# Patient Record
Sex: Male | Born: 1990 | Race: Black or African American | Hispanic: No | Marital: Single
Health system: Southern US, Community
[De-identification: ages and names within clinical notes are randomized; demographics above are authoritative.]

## PROBLEM LIST (undated history)

## (undated) DIAGNOSIS — K219 Gastro-esophageal reflux disease without esophagitis: Secondary | ICD-10-CM

## (undated) DIAGNOSIS — R011 Cardiac murmur, unspecified: Secondary | ICD-10-CM

## (undated) DIAGNOSIS — J189 Pneumonia, unspecified organism: Secondary | ICD-10-CM

## (undated) DIAGNOSIS — J302 Other seasonal allergic rhinitis: Secondary | ICD-10-CM

## (undated) DIAGNOSIS — G7101 Duchenne or Becker muscular dystrophy: Secondary | ICD-10-CM

## (undated) HISTORY — PX: SPINAL FUSION: SHX223

## (undated) HISTORY — PX: BACK SURGERY: SHX140

## (undated) HISTORY — DX: Gastro-esophageal reflux disease without esophagitis: K21.9

## (undated) HISTORY — PX: ABDOMINAL SURGERY: SHX537

## (undated) HISTORY — PX: GASTROSTOMY TUBE PLACEMENT: SHX655

## (undated) HISTORY — PX: HIP FRACTURE SURGERY: SHX118

---
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
## 2011-02-18 LAB — BASIC METABOLIC PANEL
CO2: 19
Calcium: 9.3
Chloride: 102
Creatinine, Ser: 0.34 — ABNORMAL LOW
Glucose, Bld: 75
Sodium: 137

## 2011-08-28 ENCOUNTER — Emergency Department (HOSPITAL_COMMUNITY): Payer: Medicaid Other

## 2011-08-28 ENCOUNTER — Emergency Department (HOSPITAL_COMMUNITY)
Admission: EM | Admit: 2011-08-28 | Discharge: 2011-08-28 | Disposition: A | Discharge status: Home / Self Care | Payer: Medicaid Other | Attending: Emergency Medicine | Admitting: Emergency Medicine

## 2011-08-28 ENCOUNTER — Encounter (HOSPITAL_COMMUNITY): Payer: Self-pay

## 2011-08-28 DIAGNOSIS — S82839A Other fracture of upper and lower end of unspecified fibula, initial encounter for closed fracture: Secondary | ICD-10-CM

## 2011-08-28 DIAGNOSIS — M25473 Effusion, unspecified ankle: Secondary | ICD-10-CM | POA: Insufficient documentation

## 2011-08-28 DIAGNOSIS — IMO0002 Reserved for concepts with insufficient information to code with codable children: Secondary | ICD-10-CM | POA: Insufficient documentation

## 2011-08-28 DIAGNOSIS — G7109 Other specified muscular dystrophies: Secondary | ICD-10-CM | POA: Insufficient documentation

## 2011-08-28 DIAGNOSIS — Z79899 Other long term (current) drug therapy: Secondary | ICD-10-CM | POA: Insufficient documentation

## 2011-08-28 DIAGNOSIS — M25476 Effusion, unspecified foot: Secondary | ICD-10-CM | POA: Insufficient documentation

## 2011-08-28 DIAGNOSIS — M25579 Pain in unspecified ankle and joints of unspecified foot: Secondary | ICD-10-CM | POA: Insufficient documentation

## 2011-08-28 DIAGNOSIS — S82899A Other fracture of unspecified lower leg, initial encounter for closed fracture: Secondary | ICD-10-CM | POA: Insufficient documentation

## 2011-08-28 MED ORDER — HYDROCODONE-ACETAMINOPHEN 5-325 MG PO TABS: 1.0000 | ORAL_TABLET | Freq: Four times a day (QID) | ORAL | Status: DC | PRN

## 2011-08-28 MED ORDER — HYDROCODONE-ACETAMINOPHEN 7.5-500 MG/15ML PO SOLN: 15.0000 mL | Freq: Four times a day (QID) | ORAL | Status: AC | PRN
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
## 2011-08-28 NOTE — Discharge Instructions (Signed)
Follow up with the orthopedist provided.  Ice and elevate the ankle.  Return here as needed.

## 2011-08-30 NOTE — ED Provider Notes (Signed)
Medical screening examination/treatment/procedure(s) were performed by non-physician practitioner and as supervising physician I was immediately available for consultation/collaboration.   Babette Relic, MD 08/30/11 2211

## 2012-06-12 ENCOUNTER — Encounter (HOSPITAL_COMMUNITY): Payer: Self-pay | Admitting: Family Medicine

## 2012-06-12 ENCOUNTER — Emergency Department (HOSPITAL_COMMUNITY)
Admission: EM | Admit: 2012-06-12 | Discharge: 2012-06-12 | Disposition: A | Discharge status: Home / Self Care | Payer: Medicaid Other | Attending: Emergency Medicine | Admitting: Emergency Medicine

## 2012-06-12 ENCOUNTER — Emergency Department (HOSPITAL_COMMUNITY): Payer: Medicaid Other

## 2012-06-12 DIAGNOSIS — Y939 Activity, unspecified: Secondary | ICD-10-CM | POA: Insufficient documentation

## 2012-06-12 DIAGNOSIS — W06XXXA Fall from bed, initial encounter: Secondary | ICD-10-CM | POA: Insufficient documentation

## 2012-06-12 DIAGNOSIS — G7109 Other specified muscular dystrophies: Secondary | ICD-10-CM | POA: Insufficient documentation

## 2012-06-12 DIAGNOSIS — Y929 Unspecified place or not applicable: Secondary | ICD-10-CM | POA: Insufficient documentation

## 2012-06-12 DIAGNOSIS — Z993 Dependence on wheelchair: Secondary | ICD-10-CM | POA: Insufficient documentation

## 2012-06-12 DIAGNOSIS — Z8739 Personal history of other diseases of the musculoskeletal system and connective tissue: Secondary | ICD-10-CM | POA: Insufficient documentation

## 2012-06-12 DIAGNOSIS — S96919A Strain of unspecified muscle and tendon at ankle and foot level, unspecified foot, initial encounter: Secondary | ICD-10-CM

## 2012-06-12 DIAGNOSIS — S93609A Unspecified sprain of unspecified foot, initial encounter: Secondary | ICD-10-CM | POA: Insufficient documentation

## 2012-06-12 DIAGNOSIS — Z79899 Other long term (current) drug therapy: Secondary | ICD-10-CM | POA: Insufficient documentation

## 2012-06-12 MED ORDER — HYDROCODONE-ACETAMINOPHEN 7.5-500 MG/15ML PO SOLN
ORAL | Status: DC
Start: 2012-06-12 — End: 2012-09-13

## 2012-06-12 NOTE — ED Notes (Signed)
Per mom pt fell out of bed and injured left foot. Foot swelling.

## 2012-06-12 NOTE — ED Notes (Signed)
Pt given Ginger Ale.  

## 2012-06-12 NOTE — ED Notes (Signed)
[place] out of bed 2 days ago. C/o left foot pain. Tenderness, swelling noted.

## 2012-06-12 NOTE — ED Provider Notes (Signed)
History     CSN: [id number]  Arrival date & time 06/12/12  22   First MD Initiated Contact with Patient 06/12/12 1955      Chief Complaint  Patient presents with  . Foot Injury    (Consider location/radiation/quality/duration/timing/severity/associated sxs/prior treatment) Patient is a 22 y.o. male presenting with foot injury.  Foot Injury    this 22 year old male is wheelchair-bound and nonambulatory chronically for muscular dystrophy, he accidentally slipped out of bed a couple days ago however and twisted his left foot causing pain to the left foot only, he is no new weakness or numbness, he is no other injury, there is no head or neck injury no chest shortness of breath abdominal pain back pain or other concerns. There is no treatment prior to arrival other than over-the-counter pain medications elevation and ice with partial improvement.  Past Medical History  Diagnosis Date  . Muscular dystrophy     Past Surgical History  Procedure Date  . Back surgery   . Joint replacement   . Abdominal surgery     History reviewed. No pertinent family history.  History  Substance Use Topics  . Smoking status: Never Smoker   . Smokeless tobacco: Not on file  . Alcohol Use: No      Review of Systems  10 Systems reviewed and are negative for acute change except as noted in the HPI.  Allergies  Review of patient's allergies indicates no known allergies.  Home Medications   Current Outpatient Rx  Name  Route  Sig  Dispense  Refill  . ZYRTEC PO   Oral   Take 5 mLs by mouth every evening.         Marland Kitchen PRESCRIPTION MEDICATION   Oral   Take 10 mL/mL by mouth every 6 (six) hours as needed. For pain: hydrocodone syrup         . RANITIDINE HCL PO   Oral   Take 2 mLs by mouth every evening.          Marland Kitchen HYDROCODONE-ACETAMINOPHEN 7.5-500 MG/15ML PO SOLN      58ml po q4 hours prn pain   120 mL   0     BP 112/85  Pulse 99  Temp 98 F (36.7 C)  Resp 18  SpO2  100%  Physical Exam  Nursing note and vitals reviewed. Constitutional:       Awake, alert, nontoxic appearance. Baseline wheelchair-bound cachectic appearance with muscular atrophy.  HENT:  Head: Normocephalic and atraumatic.  Eyes: Conjunctivae normal and EOM are normal. Pupils are equal, round, and reactive to light. Right eye exhibits no discharge. Left eye exhibits no discharge.  Neck: Neck supple.  Cardiovascular: Normal rate, regular rhythm and normal heart sounds.   No murmur heard. Pulmonary/Chest: Effort normal and breath sounds normal. No respiratory distress. He has no wheezes. He has no rales. He exhibits no tenderness.  Abdominal: Soft. Bowel sounds are normal. There is no tenderness. There is no rebound.  Musculoskeletal: He exhibits tenderness.       Feet:       Pt has muscular dystrophy, not ambulatory at baseline. Both arms and right leg nontender. Left leg is nontender at the thigh lower leg ankle. Left foot has capillary refill less than 2 seconds baseline light touch baseline movement with mild swelling and tenderness to the forefoot worse with movement of his toes but no specific focal bony tenderness.  Neurological: He is alert.       Mental status  and motor strength appears baseline for patient and situation.  Skin: Skin is warm and dry. No rash noted. No erythema.  Psychiatric: He has a normal mood and affect.    ED Course  Procedures (including critical care time)  Labs Reviewed - No data to display No results found.   1. Strain of foot       MDM   Pt stable in ED with no significant deterioration in condition. Patient / Family / Caregiver informed of clinical course, understand medical decision-making process, and agree with plan.  I doubt any other EMC precluding discharge at this time including, but not necessarily limited to the following:Fx requiring treatment.         Babette Relic, MD 06/19/12 [phone]

## 2012-09-13 ENCOUNTER — Emergency Department (HOSPITAL_COMMUNITY): Payer: Medicaid Other

## 2012-09-13 ENCOUNTER — Inpatient Hospital Stay (HOSPITAL_COMMUNITY)
Admission: EM | Admit: 2012-09-13 | Discharge: 2012-09-16 | DRG: 194 | Disposition: A | Discharge status: Home / Self Care | Payer: Medicaid Other | Attending: Pediatrics | Admitting: Pediatrics

## 2012-09-13 ENCOUNTER — Encounter (HOSPITAL_COMMUNITY): Payer: Self-pay | Admitting: Emergency Medicine

## 2012-09-13 DIAGNOSIS — J309 Allergic rhinitis, unspecified: Secondary | ICD-10-CM | POA: Diagnosis present

## 2012-09-13 DIAGNOSIS — L600 Ingrowing nail: Secondary | ICD-10-CM | POA: Diagnosis present

## 2012-09-13 DIAGNOSIS — J189 Pneumonia, unspecified organism: Secondary | ICD-10-CM

## 2012-09-13 DIAGNOSIS — G7109 Other specified muscular dystrophies: Secondary | ICD-10-CM | POA: Diagnosis present

## 2012-09-13 DIAGNOSIS — D72829 Elevated white blood cell count, unspecified: Secondary | ICD-10-CM | POA: Diagnosis present

## 2012-09-13 DIAGNOSIS — G7100 Muscular dystrophy, unspecified: Secondary | ICD-10-CM

## 2012-09-13 HISTORY — DX: Duchenne or Becker muscular dystrophy: G71.01

## 2012-09-13 LAB — CBC WITH DIFFERENTIAL/PLATELET
Basophils Relative: 0 % (ref 0–1)
HCT: 38.1 % — ABNORMAL LOW (ref 39.0–52.0)
Hemoglobin: 13.1 g/dL (ref 13.0–17.0)
Lymphocytes Relative: 8 % — ABNORMAL LOW (ref 12–46)
Lymphs Abs: 0.9 10*3/uL (ref 0.7–4.0)
Monocytes Absolute: 0.8 10*3/uL (ref 0.1–1.0)
Monocytes Relative: 7 % (ref 3–12)
Neutro Abs: 10.3 10*3/uL — ABNORMAL HIGH (ref 1.7–7.7)
Neutrophils Relative %: 85 % — ABNORMAL HIGH (ref 43–77)
RBC: 4.43 MIL/uL (ref 4.22–5.81)
WBC: 12 10*3/uL — ABNORMAL HIGH (ref 4.0–10.5)

## 2012-09-13 LAB — POCT I-STAT, CHEM 8
BUN: 7 mg/dL (ref 6–23)
Chloride: 100 mEq/L (ref 96–112)
Creatinine, Ser: 0.2 mg/dL — ABNORMAL LOW (ref 0.50–1.35)
Glucose, Bld: 95 mg/dL (ref 70–99)
HCT: 39 % (ref 39.0–52.0)
Potassium: 4 mEq/L (ref 3.5–5.1)

## 2012-09-13 MED ORDER — ACETAMINOPHEN 160 MG/5ML PO SOLN
650.0000 mg | Freq: Once | ORAL | Status: AC
  Administered 2012-09-13: 650 mg via ORAL
  Filled 2012-09-13: qty 20.3

## 2012-09-13 MED ORDER — DEXTROSE 5 % IV SOLN
1.0000 g | Freq: Once | INTRAVENOUS | Status: AC
  Administered 2012-09-13: 1 g via INTRAVENOUS
  Filled 2012-09-13: qty 10

## 2012-09-13 MED ORDER — ACETAMINOPHEN 325 MG PO TABS: 650.0000 mg | ORAL_TABLET | Freq: Once | ORAL | Status: DC

## 2012-09-13 MED ORDER — SODIUM CHLORIDE 0.9 % IV BOLUS (SEPSIS)
1000.0000 mL | Freq: Once | INTRAVENOUS | Status: AC
  Administered 2012-09-13: 1000 mL via INTRAVENOUS

## 2012-09-13 MED ORDER — DEXTROSE 5 % IV SOLN
500.0000 mg | Freq: Once | INTRAVENOUS | Status: AC
  Administered 2012-09-13: 500 mg via INTRAVENOUS
  Filled 2012-09-13: qty 500

## 2012-09-13 NOTE — ED Notes (Signed)
Patient laying on stretcher at this time. Patient complaining of continuous cough with production, yellow in color. Patient also been experiencing a fever. Patient states symptoms get worse at night. Patient also has a spot on right great toe that is concerning, has small amount of drainage brown in color. Family in room with patient. Plan of care discussed with patient. Call light at bedside. Will continue to monitor.

## 2012-09-13 NOTE — ED Notes (Signed)
IV team paged at this time for IV on patient.

## 2012-09-13 NOTE — ED Provider Notes (Signed)
I saw and evaluated the patient, reviewed the resident's note and I agree with the findings and plan.  Patient with muscular dystrophy. Still followed by pediatrics. The patient with fever and cough for several days chest x-rays consistent with a community-acquired pneumonia. Antibiotic started here in the emergency department. Consultation with pediatric service they will admit. Patient is hemodynamically stable. History of fever temperature 99.3. Oxygen saturation on room air is 95%. Patient requires admission but is nontoxic. Patient is not a candidate for treatment at home due to the muscular dystrophy.  Mervin Kung, MD 09/13/12 [phone]

## 2012-09-13 NOTE — H&P (Signed)
Pediatric H&P  Patient Details:  Name: Jeremy Camacho MRN: [id number] DOB: 1990-11-14  Chief Complaint  Cough, Congestion  History of the Present Illness  Vint is a 22 yo male with history of muscular dystrophy who presents with cough and congestion. He began to have headache, fever, productive cough and bilateral back pain on Friday.  Symptoms are worse when lying down at night. He feels better during the day and when sitting up. He has had associated shortness of breath, particularly at night. Fever has been as high as 100 measured at home. He has been coughing up phlegm that he describes as green and thick. At home he has tried Mucinex and Motrin with some relief. He came to the emergency room this evening because things were not improving, however, he does not think they are getting worse. Grandmother with similar symptoms. Denies recent hospitalization.   Patient has a history of pneumonia--last episode approximately 2 years requiring hospitalization for IV antibiotics. ROS positive for nausea but denies vomiting. No diarrhea. No trouble with urination. No skin rashes.   In ED, patient was initially tachycardic and febrile up to 102.3. He was given tylenol and 1L bolus with good response. He was given 1gm Ceftriaxone and $RemoveBeforeD'500mg'ByxIuOajVyoPqD$  Azithromycin. Patient was never hypoxic and did not require oxygen. Decision was made to admit patient for observation given complication of muscular dystrophy and poor pulmonary clearance. Shogo states that he is already feeling better after fluids and initiation of antibiotics.   Patient Active Problem List  Active Problems:   Community acquired pneumonia   Muscular dystrophy   Past Birth, Medical & Surgical History   Past Medical History  Diagnosis Date  . Muscular dystrophy    Developmental History  Normal cognitive development, muscular dystrophy.   Diet History  Unrestricted now. 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 Last use  of Gtube was approximately 3 years ago per patient.   Social History  Lives at home with brother 9yo, sister 49yo and mom. Mom smokes, but goes outside.   Primary Care Provider  PEREZ-FIERY,DENISE, MD  Home Medications  Medication     Dose Zyrtec   Zantac             Allergies  No Known Allergies  Immunizations  UTD on vaccinations including flu shot.  Family History  HTN: grandparents DM: grandparents  Exam  BP 111/61  Pulse 101  Temp(Src) 99.8 F (37.7 C) (Oral)  Resp 18  SpO2 99%  Weight:     Normalized weight-for-age data available only for age 66 to 20 years.  General: Alert, interactive and appropriate, very thin appearing male with contractures, NAD.  HEENT: PERRL, MMM no OP lesions, TM clears without exudate, nasal mucosa boggy with yellow mucous, mild sinus tenderness to palpation.  Lymph nodes: Shotty cervical lymphadenopathy Chest: Normal WOB without retractions, coarse breath sounds heard throughout all lung fields posteriorly and anteriorly--worse at lung bases. Slight expiratory wheeze present. Symmetric breath entry bilaterally.  Heart: RRR, no m/r/g.  Abdomen: Scaphoid, bowel sounds present, Gtube in place--c/d/i without signs of infection. No HSM appreciated.  Extremities: 2+ symmetric pulses, cap refill <2 seconds Musculoskeletal: Significant contractures of upper and lower extremities Neurological: Movement of neck and facial muscles intact, unable to move extremities. Skin: Dry, thin appearing, no rashes.  Labs & Studies  CXR: 1. New left lower lobe pneumonia. 2. Postsurgical changes of thoracolumbar fusion. 3. Osseous changes typical of muscular dystrophy.  CBC: 12.0 >  13.1/38.1 < 180, 85% [place], ANC 10.3  Assessment  Wilmont is a 22 yo male with history of muscular dystrophy who presents with cough, congestion, fever, and shortness of breath since Friday found to have new LLL pneumonia on CXR. Exam with diffuse, coarse breath sounds. No recent  hospitalizations or medical exposure although patient does have a history of pneumonia requiring hospitalization. Most likely representing community acquired pneumonia in setting of poor pulmonary clearance due to muscular dystrophy. Will admit for observation.   Plan   1. Community Acquired Pneumonia: Most likely etiologies include S. Pneumonia, nontypeable H flu, or atypical infection such as mycoplasma. Given focal consolidation less likely to be viral but this is still on the differential. S/p CTX and Azithromycin in ED.  - Continue CTX 1g daily, azithromycin 500mg  IV daily while admitted. Can consider transition to PO as early as tomorrow if symptoms improving and patient doing better with pulmonary clearance.  - Incentive spirometry - Ibuprofen prn for fever, pain  2. Muscular Dystrophy:  - Incentive spirometry and encourage good cough for pulmonary clearance  3. Seasonal Allergies - Continue home Zyrtec  4. FEN/GI: s/p 1L NS bolus in ED. Patient with good appetite and mother bringing food to hospital. No signs or symptoms of dehydration on exam. - Regular diet - Continue home Zantac  5. Dispo/Social:  - Observation status, floor - Mother at bedside and updated on plan  Jobie Quaker 09/13/2012, 11:45 PM

## 2012-09-13 NOTE — ED Notes (Signed)
Pt here with fever and cough x several days; pt with hx of MD; pt sts productive cough

## 2012-09-13 NOTE — ED Notes (Signed)
IV team here at this time

## 2012-09-13 NOTE — ED Notes (Signed)
Patient resting on stretcher at this time. Informed patient and family that IV team would be shortly to start an IV. Patient in no acute distress, resp are even and unlabored. Call light at bedside. Family in room. Will continue to monitor.

## 2012-09-13 NOTE — ED Provider Notes (Signed)
History     CSN: [id number]  Arrival date & time 09/13/12  1750   First MD Initiated Contact with Patient 09/13/12 2116      Chief Complaint  Patient presents with  . Fever  . Cough    (Consider location/radiation/quality/duration/timing/severity/associated sxs/prior treatment) Patient is a 22 y.o. male presenting with fever. The history is provided by the patient and medical records.  Fever Max temp prior to arrival:  101F Temp source:  Oral Severity:  Moderate Onset quality:  Gradual Duration:  4 days Timing:  Constant Progression:  Worsening Chronicity:  New Relieved by:  None tried Worsened by:  Nothing tried Ineffective treatments:  None tried Associated symptoms: chills, congestion, cough, nausea and rash (paronychia on right great toe)   Associated symptoms: no chest pain, no diarrhea, no dysuria, no headaches and no vomiting   Risk factors comment:  Muscular dystrophy   Past Medical History  Diagnosis Date  . Muscular dystrophy     Past Surgical History  Procedure Laterality Date  . Back surgery    . Joint replacement    . Abdominal surgery      History reviewed. No pertinent family history.  History  Substance Use Topics  . Smoking status: Never Smoker   . Smokeless tobacco: Not on file  . Alcohol Use: No      Review of Systems  Constitutional: Positive for fever, chills, activity change, appetite change and fatigue. Negative for diaphoresis.  HENT: Positive for congestion. Negative for neck pain.   Respiratory: Positive for cough. Negative for chest tightness, shortness of breath and wheezing.   Cardiovascular: Negative for chest pain and palpitations.  Gastrointestinal: Positive for nausea. Negative for vomiting, abdominal pain, diarrhea and constipation.  Genitourinary: Negative for dysuria.  Skin: Positive for rash (paronychia on right great toe) and wound (paronychia on right great toe).  Neurological: Negative for dizziness, weakness,  light-headedness and headaches.  All other systems reviewed and are negative.    Allergies  Review of patient's allergies indicates no known allergies.  Home Medications   Current Outpatient Rx  Name  Route  Sig  Dispense  Refill  . Cetirizine HCl (ZYRTEC PO)   Oral   Take 5 mLs by mouth every evening.         Marland Kitchen guaiFENesin (MUCINEX CHEST CONGESTION CHILD) 100 MG/5ML liquid   Oral   Take 400 mg by mouth every 4 (four) hours as needed for cough or congestion.         Marland Kitchen ibuprofen (ADVIL,MOTRIN) 100 MG/5ML suspension   Oral   Take 400 mg by mouth every 4 (four) hours as needed for fever.         Marland Kitchen RANITIDINE HCL PO   Oral   Take 2 mLs by mouth every evening.            BP 118/62  Pulse 115  Temp(Src) 102.3 F (39.1 C) (Oral)  Resp 18  SpO2 100%  Physical Exam  Nursing note and vitals reviewed. Constitutional: He appears well-developed and well-nourished.  HENT:  Head: Normocephalic and atraumatic.  Right Ear: External ear normal.  Left Ear: External ear normal.  Nose: Nose normal.  Mouth/Throat: Oropharynx is clear and moist. No oropharyngeal exudate.  Eyes: Conjunctivae are normal. Pupils are equal, round, and reactive to light.  Neck: Normal range of motion. Neck supple.  Cardiovascular: Normal rate, regular rhythm, normal heart sounds and intact distal pulses.   Pulmonary/Chest: Effort normal. No respiratory distress. He has  no wheezes. He has no rales. He exhibits no tenderness.  Bilateral rhonchi   Abdominal: Soft. Bowel sounds are normal. He exhibits no distension and no mass. There is no tenderness. There is no rebound and no guarding.  Musculoskeletal: Normal range of motion. He exhibits no edema and no tenderness.  Neurological: He is alert. He displays normal reflexes. No cranial nerve deficit. He exhibits normal muscle tone. Coordination normal.  Skin: Skin is warm and dry. Rash noted. No erythema. No pallor.  Paronychia on lateral aspect of  right great toe; no evidence of surrounding cellulitis   Psychiatric: He has a normal mood and affect. His behavior is normal. Judgment and thought content normal.    ED Course  Procedures (including critical care time)  Labs Reviewed  CBC WITH DIFFERENTIAL - Abnormal; Notable for the following:    WBC 12.0 (*)    HCT 38.1 (*)    Neutrophils Relative 85 (*)    Neutro Abs 10.3 (*)    Lymphocytes Relative 8 (*)    All other components within normal limits  POCT I-STAT, CHEM 8 - Abnormal; Notable for the following:    Creatinine, Ser 0.20 (*)    Calcium, Ion 1.10 (*)    All other components within normal limits  CG4 I-STAT (LACTIC ACID)   Dg Chest 2 View  09/13/2012  *RADIOLOGY REPORT*  Clinical Data: Fever and cough.  Muscular dystrophy.  CHEST - 2 VIEW  Comparison: Two-view chest 06/14/2006.  Findings: The patient is status post multilevel thoracolumbar fusion.  Multilevel the pedicle screw rod fixation is intact.  The heart size is normal.  A left lower lobe pneumonia is evident.  The right lung is clear.  The bones are thinned, compatible with muscular dystrophy.  IMPRESSION:  1.  New left lower lobe pneumonia. 2.  Postsurgical changes of thoracolumbar fusion. 3.  Osseous changes typical of muscular dystrophy.   Original Report Authenticated By: San Morelle, M.D.      1. Community acquired pneumonia   2. Muscular dystrophy       MDM  22 yo M w/hx of muscular dystrophy presents for fever/chills, decreased PO intake for 4 days. Febrile to 102F here. Exam and CXR c/w pneumonia. Will treat for community-acquired PNA (IV Azithromycin/Rocephin). Pt also with paronychia already draining without evidence of associated cellulitis. Clinically dehydrated; will provide IVF. Pediatrics to admit.        Marco Collie, MD 09/14/12 [phone]

## 2012-09-14 ENCOUNTER — Encounter (HOSPITAL_COMMUNITY): Payer: Self-pay | Admitting: Internal Medicine

## 2012-09-14 DIAGNOSIS — J189 Pneumonia, unspecified organism: Secondary | ICD-10-CM

## 2012-09-14 DIAGNOSIS — G7100 Muscular dystrophy, unspecified: Secondary | ICD-10-CM

## 2012-09-14 MED ORDER — AZITHROMYCIN 500 MG PO TABS
500.0000 mg | ORAL_TABLET | Freq: Every day | ORAL | Status: DC
  Filled 2012-09-14: qty 1

## 2012-09-14 MED ORDER — DEXTROSE 5 % IV SOLN
1.0000 g | INTRAVENOUS | Status: DC
  Filled 2012-09-14: qty 10

## 2012-09-14 MED ORDER — CETIRIZINE HCL 5 MG/5ML PO SYRP: 5.0000 mg | ORAL_SOLUTION | Freq: Every day | ORAL | Status: DC

## 2012-09-14 MED ORDER — CETIRIZINE HCL 5 MG/5ML PO SYRP
5.0000 mg | ORAL_SOLUTION | Freq: Every day | ORAL | Status: DC
  Administered 2012-09-14 – 2012-09-16 (×3): 5 mg via ORAL
  Filled 2012-09-14 (×5): qty 5

## 2012-09-14 MED ORDER — RANITIDINE HCL 150 MG/10ML PO SYRP
150.0000 mg | ORAL_SOLUTION | Freq: Every day | ORAL | Status: DC
  Administered 2012-09-14 – 2012-09-15 (×3): 150 mg via ORAL
  Filled 2012-09-14 (×5): qty 10

## 2012-09-14 MED ORDER — RANITIDINE HCL 150 MG/10ML PO SYRP: 150.0000 mg | ORAL_SOLUTION | Freq: Every day | ORAL | Status: DC

## 2012-09-14 MED ORDER — LEVOFLOXACIN IN D5W 500 MG/100ML IV SOLN
500.0000 mg | INTRAVENOUS | Status: DC
  Administered 2012-09-14: 500 mg via INTRAVENOUS
  Filled 2012-09-14 (×2): qty 100

## 2012-09-14 MED ORDER — AZITHROMYCIN 500 MG PO TABS: 500.0000 mg | ORAL_TABLET | Freq: Every day | ORAL | Status: DC

## 2012-09-14 MED ORDER — AZITHROMYCIN 200 MG/5ML PO SUSR: 500.0000 mg | Freq: Every day | ORAL | Status: DC

## 2012-09-14 MED ORDER — IBUPROFEN 100 MG/5ML PO SUSP
400.0000 mg | Freq: Four times a day (QID) | ORAL | Status: DC | PRN
  Administered 2012-09-14 – 2012-09-15 (×2): 400 mg via ORAL
  Filled 2012-09-14 (×2): qty 20

## 2012-09-14 NOTE — H&P (Signed)
I saw and evaluated Jeremy Camacho with the resident team, performing the key elements of the service. I developed the management plan with the resident that is described in the  note, and I agree with the content. My detailed findings are below. Exam: BP 119/82  Pulse 111  Temp(Src) 99.9 F (37.7 C) (Oral)  Resp 18  Ht $R'5\' 6"'Un$  (1.676 m)  Wt 52.164 kg (115 lb)  BMI 18.57 kg/m2  SpO2 98% Awake and alert, no distress, interactive PERRL, EOMI,  Nares: no discharge Moist mucous membranes Lungs: Normal work of breathing, course breath sounds bilaterally Camacho>R Heart: RR, nl s1s2 Abd: BS+ soft nontender, nondistended, no hepatosplenomegaly, gtube in place Ext: warm and well perfused, thin, left big toe with small amount of blood from healing ingrown toenail Neuro: contractures of arms and legs with low muscle mass, weakness throughout secondary to MD   Key studies: CXR- LLL infiltrate, WBC 12, ANC 10.3  Impression and Plan: 22 y.o. male with history of muscular dystrophy who presented with cough, fever, congestion and CXR concerning for new infiltrate/pneumonia.  Overall looking clinically well with out an oxygen requirement.  Will switch antibiotics to levaquin given age, start ted hose for VTE prophylaxis and if he continues to show clinical stability then d/c to home tomorrow.    Jeremy Camacho                  11/30/2955, 4:73 PM    I certify that the patient requires care and treatment that in my clinical judgment will cross two midnights, and that the inpatient services ordered for the patient are (1) reasonable and necessary and (2) supported by the assessment and plan documented in the patient's medical record.  I saw and evaluated Jeremy Camacho, performing the key elements of the service. I developed the management plan that is described in the resident's note, and I agree with the content. My detailed findings are below.

## 2012-09-14 NOTE — Progress Notes (Signed)
My addendum can be found on the H&P but written at the same date and time as this note.  Only additional addendum is that we did contact Dr Vallarie Mare at [facility] and he explained that Jeremy Camacho is not being started on CPAP but instead a volume support  that can provide increasing support as his disease worsens over time.

## 2012-09-14 NOTE — Progress Notes (Signed)
Clinical Social Work Department PSYCHOSOCIAL ASSESSMENT - PEDIATRICS 09/14/2012  Patient:  Jeremy Camacho, Jeremy Camacho  Account Number:  [id number]  [place] Date:  09/13/2012  Clinical Social Worker:  Clint Bolder, LCSW   Date/Time:  09/14/2012 02:45 PM  Date Referred:  09/14/2012   Referral source  Physician     Referred reason  Psychosocial assessment   Other referral source:    I:  FAMILY / HOME ENVIRONMENT Ardine Eng legal guardian:  PARENT  Guardian - Name [place] - Age Guardian - Address  [place]   Other household support members/support persons Other support:    II  PSYCHOSOCIAL DATA Information Source:  Family Interview  Museum/gallery curator and [employer] Employment:   Mom is currently employed at [employer] resources:  Medicaid If [place]:  [employer] / Grade:  N/A Music therapist / Industrial/product designer / Early Interventions:  Cultural issues impacting care:    III  STRENGTHS Strengths  Adequate Resources  Compliance with medical plan  Understanding of illness  Supportive family/friends   Strength comment:    IV  RISK FACTORS AND CURRENT PROBLEMS Current Problem:  None   Risk Factor & Current Problem Patient Issue Family Issue Risk Factor / Current Problem Comment   N N     V  SOCIAL WORK ASSESSMENT CSW intern met with patient and patient's mother who was seated at bedside. Family was happy to talk with CSW intern about pt's illness and support services they receive. Pt lives at home with mom, grandmother, 73yr old sister, and 12 yr old brother. Mom is currently working part-time at [employer] while she finishes up her associates degree in early childhood education. Mom expressed concerns about recertification of her food stamps. She stated that there was an error in her address and that the food stamp recertification papers were sent to her old address. She has recently  reapplied but is still waiting to receive them. CSW intern encouraged mom to continuously call and check the status until she receives them. Mom was thankful of interns advice and encouragement gave mom meal tickets so she could go get lunch and dinner. Mom stated that she has been a bit overwhelmed because her daughter was in the hospital two weeks ago after a bad car accident.She also stated that her 57 yr old son has recently been diagnosed with a form of MD as well. She states that she wants patient to get well before discharging. Family is well connected with support services and is connected with the [employer] ( followed by Bertell Maria). Pt talked to [place] intern about interests/hobbies and his upcoming 22st birthday. Mom and patient were both engaging and extremely kind. Will continue to provide support until d/c      VI SOCIAL WORK PLAN Social Work Plan  No Further Intervention Required / No Barriers to Discharge   Type of pt/family education:   If child protective services report - county:   If child protective services report - date:   Information/referral to community resources comment:   Other social work plan:

## 2012-09-14 NOTE — Progress Notes (Signed)
Pediatric [facility] Progress Note  Patient name: Jeremy Camacho Medical record number: [id number] Date of birth: [birth year] Age: 22 y.o. Gender: male    LOS: 1 day   Primary Care Provider: PEREZ-FIERY,DENISE, MD  Subjective: Pt seen at bedside, mother present in room. Pt states he feels some better, but "not 100%" yet. Pt would like to get up out of bed and feels like he can cough and clear secretions better in his wheelchair; reports coughing up secretions better since starting fluids and abx. Mom does request exam of right great toe; states that he has had drainage from what "looked like an infection," to her.  Mom reports being seen recently in MD clinic ([facility], main physician is Dr. Vallarie Mare) and is apparently in the process of getting CPAP at home set up through [facility], but states pt has not been seen by a pulmonologist and has never worn a CPAP at home or been titrated yet. Pt does have cough assist device at home (or is to be getting one).  Objective: Vital signs in last 24 hours: Temp:  [98.8 F (37.1 C)-102.3 F (39.1 C)] 99.3 F (37.4 C) (04/24 0824) Pulse Rate:  [80-125] 96 (04/24 0824) Resp:  [18-20] 20 (04/24 0824) BP: (101-119)/(49-82) 119/82 mmHg (04/24 0824) SpO2:  [95 %-100 %] 98 % (04/24 0824) Weight:  [52.164 kg (115 lb)] 52.164 kg (115 lb) (04/24 0053)  Wt Readings from Last 3 Encounters:  09/14/12 52.164 kg (115 lb)    Intake/Output Summary (Last 24 hours) at 09/14/12 1157 Last data filed at 09/14/12 0900  Gross per 24 hour  Intake   1240 ml  Output      0 ml  Net   1240 ml   UOP: not all urine collected  PE: BP 119/82  Pulse 96  Temp(Src) 99.3 F (37.4 C) (Oral)  Resp 20  Ht $R'5\' 6"'MD$  (1.676 m)  Wt 52.164 kg (115 lb)  BMI 18.57 kg/m2  SpO2 98% General: young adult male, very thin, alert in NAD, interactive/appropriate; numerous contractures, at baseline HEENT: PERRL, MMM Chest: comfortable WOB without retractions; diffuse coarse breath sounds  remain but now without discreet wheeze  Fairly good air movement, symmetric bilaterally Heart: RRR, no murmur Abdomen: soft, nontender; G-tube in place, clean, site dry without redness/induration; no HSM appreciated.  Extremities/MSK: warm, well-perfused; very thin with contractures as above   Right great toe with healing ?ingrown toenail, small eschar to lateral nail but no frank drainage/redness/warmth Neurological: Movement of neck and facial muscles intact, very little to no movement of extremities.  Skin: Dry, relatively thin skin but no rashes or obvious breakdown  Labs/Studies:   Recent Labs Lab 09/13/12 2137 09/13/12 2154  WBC 12.0*  --   HGB 13.1 13.3  HCT 38.1* 39.0  PLT 180  --   85% neutrophil predominance  Recent Labs Lab 09/13/12 2154  NA 137  K 4.0  CL 100  GLUCOSE 95  BUN 7  CREATININE 0.20*   Lactic acid 4/23 $RemoveB'@2155'QZhPCqxp$ : 1.80  CXR 4/23 $Remove'@1858'SCaSLjo$ : Findings: The patient is status post multilevel thoracolumbar  fusion. Multilevel the pedicle screw rod fixation is intact. The  heart size is normal. A left lower lobe pneumonia is evident. The  right lung is clear. The bones are thinned, compatible with  muscular dystrophy.  IMPRESSION:  1. New left lower lobe pneumonia.  2. Postsurgical changes of thoracolumbar fusion.  3. Osseous changes typical of muscular dystrophy.  Assessment/Plan: Jeremy Camacho is a 22  yo male presenting with ~5 days of cough, congestion, fever, and SOB; PMH significant for muscular dystrophy. CXR on admission consistent with new LLL pneumonia on CXR; WBC only 12, but with 85% neutrophil predominance. Last fever 4/24 ~2115 prior to starting abx. Subjectively feeling better, though exam still with diffuse coarse breath sounds.  #Community Acquired Pneumonia:  -s/p CTX 1g and azithromycin 500 mg in ED -transitioned to Levaquin IV today; plan to change to PO tomorrow pending continued clinical improvement/stability -continue ibuprofen PRN for fever,  pain   #Muscular Dystrophy:  -continue incentive spirometry, encourage good cough -mother reports cough assist device use at home; will attempt to provide device here, along with chest vest PT -mother reports being seen recently at MD clinic; CPAP in the process of being set up at home through [facility] (see below)   #Right great toe -?ingrown toenail now healing with small eschar; no frank redness/induration to suggest cellulitis or abscess -advised daily foot inspections, gentle but thorough cleaning and gentle drying, etc  #Seasonal allergies - continue home Zyrtec   #FEN/GI:  -s/p 1L NS bolus in ED; otherwise no on IVF - Regular diet  - Continue home Zantac   #Prophylaxis - TED hose for DVT ppx; avoid Lovenox  #Dispo/Social:  -management as above; likely discharge tomorrow morning, pending stability/improvement overnight -will attempt to contact muscular dystrophy clinic at [facility] (Dr. Vallarie Mare)  -will alert them of pt's admission and discuss any recommendations for f/u, etc  -will also discuss CPAP (?starting here tonight if possible to see toleration, ?starting at home, etc) -Mother at bedside and updated on plan  See also attending note(s) for any further details/final plans/additions.  Emmaline Kluver, MD PGY-1, [place] Intern Pager [phone] 09/14/2012 11:57 AM

## 2012-09-14 NOTE — Progress Notes (Signed)
UR completed 

## 2012-09-14 NOTE — Progress Notes (Signed)
Pt's mom was bedside when I arrived. Pt was awake and we talked about the possibility that he might go home tomorrow. Pt's mom talked about daughter's recent accident and other family member dynamics. I offered compassionate and supportive listening in addition to prayer. Pt and  mother were very thankful for prayer and spiritual support. Ernest Haber Chaplain  09/14/12 1700  Clinical Encounter Type  Visited With Patient and family together

## 2012-09-14 NOTE — Patient Care Conference (Signed)
Multidisciplinary Family Care Conference Present:  Terri Bauert LCSW, Georgana Curio RN Case Manager, Brynda Greathouse DieticianHyacinth Meeker Rec. Therapist, Dr. Kathie Rhodes, Candace Ysidro Evert RN, Abe People RN, BSN, [place] Dept., Blaine Hamper RN ChaCC  Attending: Tamera Punt Patient RN: Jethro Bastos   Plan of Care: 22 yr old still in pediatirc care with MD. SW to see family to assess supports.

## 2012-09-14 NOTE — ED Notes (Signed)
Patient report called to leslie for admission, nurse voiced understanding of report and had no further questions. Patient is in stable condition at this time for transport. Will be going via stretcher to room. Mother with patient, and had belongings.

## 2012-09-15 DIAGNOSIS — J189 Pneumonia, unspecified organism: Principal | ICD-10-CM

## 2012-09-15 DIAGNOSIS — G7109 Other specified muscular dystrophies: Secondary | ICD-10-CM

## 2012-09-15 MED ORDER — LEVOFLOXACIN 500 MG PO TABS
500.0000 mg | ORAL_TABLET | Freq: Every day | ORAL | Status: DC
  Filled 2012-09-15: qty 1

## 2012-09-15 MED ORDER — LEVOFLOXACIN 25 MG/ML PO SOLN
500.0000 mg | Freq: Every day | ORAL | Status: DC
  Administered 2012-09-15: 500 mg via ORAL
  Filled 2012-09-15 (×3): qty 20

## 2012-09-15 NOTE — Discharge Summary (Signed)
Pediatric Teaching Program  [address]  [place], [place] [address] Phone: [phone] Fax: [phone]  Patient Details  Name: Jeremy Camacho MRN: [id number] DOB: 02/29/1991  DISCHARGE SUMMARY    Dates of Hospitalization: 09/13/2012 to 09/16/2012  Reason for Hospitalization: Community Acquired Pneumonia  Problem List: Active Problems:   Community acquired pneumonia   Muscular dystrophy   Final Diagnoses: Community Acquired Pneumonia  Brief Hospital Course:  Jeremy Camacho is a 22 yo male with history of muscular dystrophy who presents with cough, congestion, fever, and shortness of breath since Friday 4/18, found to have new LLL pneumonia on admission CXR. Lab findings with leukocytosis and left shift. Was initially started on CTX 1g and Azithromycin $RemoveBeforeDE'500mg'ELngLuDGAGMjpxI$  IV in ED as well as given 1L NS bolus. Patient was transitioned to Levaquin IV on hospital day 1. He had improvement in symptoms including improvement in fever over 24 hour period. He was transitioned to PO levaquin (500 mg day) on 4/25, with plan to complete a 10-day course on 5/3. He never required oxygenation, and at the time of discharge his fever curve was downtrending.  Regarding his muscular dystrophy, he was initiated on incentive spirometry, and chest PT with vest was done to promote mucous clearance during this admission. Dr. Vallarie Mare with muscular dystrophy clinic at [facility] was also notified of patient's admission. There are plans to deliver AVAPS and cough assist to the family soon.  Home zyrtec and zantac were continued.   Patient had additional complaint of R. Great toenail with small eschar likely representing ingrown toenail. No erythema or induration to suggest cellulitis. Recommended daily foot inspections with good hygiene.   Focused Discharge Exam: BP 123/86  Pulse 76  Temp(Src) 97.9 F (36.6 C) (Axillary)  Resp 20  Ht $R'5\' 6"'zs$  (1.676 m)  Wt 52.164 kg (115 lb)  BMI 18.57 kg/m2  SpO2 96%  General: Alert,  interactive and appropriate, very thin appearing male with contractures, NAD.  HEENT: Moist mucous membranes. EOMI. No conjunctival injection. Chest: Comfortable work of breathing. Full aeration, with scattered intermittent rales throughout the lung fields, with crackles in the left lung fields, anteriorly. Heart: RRR, no m/r/g.  Cap refill <2 seconds  Abdomen: Scaphoid, bowel sounds present, Gtube in place--c/d/i without signs of infection. Extremities: 2+ symmetric pulses. No edema. Musculoskeletal: Significant contractures of upper and lower extremities  Neurological: Movement of neck and facial muscles intact, unable to move extremities.  Skin: No rashes  Discharge Weight: 52.164 kg (115 lb) (stated wt)   Discharge Condition: Improved  Discharge Diet: Resume diet  Discharge Activity: Ad lib   Procedures/Operations: None Consultants: None  Discharge Medication List    Medication List    TAKE these medications       ibuprofen 100 MG/5ML suspension  Commonly known as:  ADVIL,MOTRIN  Take 400 mg by mouth every 4 (four) hours as needed for fever.     levofloxacin 25 MG/ML solution  Commonly known as:  LEVAQUIN  Take 20 mLs (500 mg total) by mouth daily.     MUCINEX CHEST CONGESTION CHILD 100 MG/5ML liquid  Generic drug:  guaiFENesin  Take 400 mg by mouth every 4 (four) hours as needed for cough or congestion.     RANITIDINE HCL PO  Take 2 mLs by mouth every evening.     ZYRTEC PO  Take 5 mLs by mouth every evening.        Immunizations Given (date): none  Follow-up Information   Follow up with PEREZ-FIERY,DENISE, MD  On 09/18/2012. (Appointment time is 10:30 AM)    Contact information:   [address] [place] [address] [phone]       Follow up with Christa See, MD On 10/09/2012. (Appointment at 11 AM)    Contact information:   [place] [address] [phone]       Follow Up Issues/Recommendations: 1.  Community-acquired pneumonia - Please assess for continued symptoms (fever, cough/sputum production, etc) and ensure that pt is continuing to take abx to completion of therapy (last day is 5/3).  2. Transition of care - Pt and mother expressed that they would like pt to follow up with [facility] once pt ages out of pediatrics, which will be in June (mother's PCP is at [facility], Dr. Barbra Sarks). Pt is set up for f/u with Dr. Sharen Counter at [facility] for direct f/u from this hospitalization, and has an appointment with Dr. Venetia Maxon at [facility] on 5/19. Mother has signed a release of information form for [place] to send records to [facility] and will contact [facility] to send older records to family medicine, as well. Dr. Venetia Maxon will be in contact with pt's muscular dystrophy clinic providers at [facility], as well.  Pending Results: none  Specific instructions to the patient and/or family : - Please take levaquin as directed, and keep your follow-up appointments as scheduled   Jeremy Camacho 09/16/2012, 11:30 AM  I saw and examined Jeremy Camacho on family-centered rounds and discussed the plan with his family and the team.  I agree with the resident summary and exam as above. Jeremy Camacho 09/16/2012

## 2012-09-15 NOTE — Progress Notes (Signed)
I saw and evaluated Jeremy Camacho, performing the key elements of the service. I developed the management plan that is described in the resident's note, and I agree with the content. My detailed findings are below. [name] reports much improved from admission and sleeping fairly well overnight. Lungs clear no increase in work of breathing, no murmur Warm and well perfused   Patient Active Problem List   Diagnosis Date Noted  . Community acquired pneumonia 09/14/2012  . Muscular dystrophy 09/14/2012   Stable on room air.  Will transition to po levaquin today Will discharge home this pm if still stable on po medication  Branden Shallenberger,ELIZABETH K 09/15/2012 3:44 PM

## 2012-09-15 NOTE — Progress Notes (Signed)
Pediatric [facility] Progress Note  Patient name: Jeremy Camacho Medical record number: [id number] Date of birth: 12/04/1990 Age: 22 y.o. Gender: male    LOS: 2 days   Primary Care Provider: PEREZ-FIERY,DENISE, MD  Subjective: Pt seen at bedside, mother present in room. Pt continues to feel better but still not feeling completely back to normal, with less productive sputum. Mother feels comfortable going home, but pt states he feels uncertain about going home, today, as opposed to tomorrow morning. No fever since 2115 on 4/23; elevated temp to 100.2 4/24 at 1517. Otherwise has no new concerns.  Objective: Vital signs in last 24 hours: Temp:  [98.6 F (37 C)-100.2 F (37.9 C)] 99 F (37.2 C) (04/25 1202) Pulse Rate:  [84-111] 97 (04/25 1202) Resp:  [16-18] 18 (04/25 1202) BP: (123)/(78) 123/78 mmHg (04/25 0835) SpO2:  [96 %-100 %] 99 % (04/25 1202)  Wt Readings from Last 3 Encounters:  09/14/12 52.164 kg (115 lb)    Intake/Output Summary (Last 24 hours) at 09/15/12 1322 Last data filed at 09/15/12 1200  Gross per 24 hour  Intake    700 ml  Output    600 ml  Net    100 ml   UOP: not all urine measured  PE: BP 123/78  Pulse 97  Temp(Src) 99 F (37.2 C) (Oral)  Resp 18  Ht $R'5\' 6"'eF$  (1.676 m)  Wt 52.164 kg (115 lb)  BMI 18.57 kg/m2  SpO2 99% General: young adult male, very thin, alert in NAD, interactive/appropriate; numerous contractures, at baseline HEENT: PERRL, MMM Chest: comfortable WOB without retractions;   diffuse coarse breath sounds remain, gradually improving  Fairly good air movement, symmetric bilaterally Heart: RRR, no murmur Abdomen: soft, nontender; G-tube in place, clean, site dry without redness/induration; no HSM appreciated.  Extremities/MSK: warm, well-perfused; very thin with contractures as above   Right great toe with healing ?ingrown toenail, small eschar to lateral nail but no frank drainage/redness/warmth Neurological: Movement of  neck and facial muscles intact, very little to no movement of extremities.  Skin: Dry, relatively thin skin but no rashes or obvious breakdown  Labs/Studies:   Recent Labs Lab 09/13/12 2137 09/13/12 2154  WBC 12.0*  --   HGB 13.1 13.3  HCT 38.1* 39.0  PLT 180  --   85% neutrophil predominance  Recent Labs Lab 09/13/12 2154  NA 137  K 4.0  CL 100  GLUCOSE 95  BUN 7  CREATININE 0.20*   Lactic acid 4/23 $RemoveB'@2155'ZirRqAFa$ : 1.80  CXR 4/23 $Remove'@1858'mBBQiOm$ : Findings: The patient is status post multilevel thoracolumbar  fusion. Multilevel the pedicle screw rod fixation is intact. The  heart size is normal. A left lower lobe pneumonia is evident. The  right lung is clear. The bones are thinned, compatible with  muscular dystrophy.  IMPRESSION:  1. New left lower lobe pneumonia.  2. Postsurgical changes of thoracolumbar fusion.  3. Osseous changes typical of muscular dystrophy.  Assessment/Plan: Ericson is a 22 yo male presenting with ~5 days of cough, congestion, fever, and SOB; PMH significant for muscular dystrophy. CXR on admission consistent with new LLL pneumonia on CXR; WBC only 12, but with 85% neutrophil predominance. Last fever 4/23 ~2115 prior to starting abx; elevated temp to 100.2 4/24 at 1517, but no further elevated temps. Subjectively feeling better, with exam still significant for diffuse coarse breath sounds, but improving and less productive sputum. Now on Levaquin PO.  #Community Acquired Pneumonia:  -s/p CTX 1g and azithromycin 500 mg  in ED -transitioned to Levaquin IV 4/24, now on PO to continue for total 10 days -continue ibuprofen PRN for fever, pain   #Muscular Dystrophy:  -continue incentive spirometry, encourage good cough -mother in the process of getting home supplies through home health, through MD clinic at [facility] (similar to CPAP, but volume without pressure) to support breathing, over time  -cough assist device -otherwise will need to continue to f/u with  MD clinic  #Right great toe -?ingrown toenail now healing with small eschar; no frank redness/induration to suggest cellulitis or abscess -no worsening appearance, currently -advised daily foot inspections, gentle but thorough cleaning and gentle drying, etc  #Seasonal allergies - continue home Zyrtec   #FEN/GI:  -s/p 1L NS bolus in ED; otherwise no on IVF - Regular diet  - Continue home Zantac   #Prophylaxis - TED hose for DVT ppx; avoid Lovenox  #Dispo/Social:  -management as above; possible discharge later today vs observation overnight -muscular dystrophy clinic at [facility] (Dr. Vallarie Mare) contacted 4/25  -no specific recommendations; pt to f/u with them  -CPAP clarified as AVAPS, as above; home health needs already being arranged through MD clinic -pt nearing age-out for pediatrics  -will plan to f/u with current PCP (Dr. Sharen Counter, now at [facility]) at least one more time, for hosp f/u  -will plan for pt to begin following at [facility]  -will attempt to aid transition of PCP and transfer of records, etc -Mother at bedside and updated on plan  See also attending note(s) for any further details/final plans/additions.  Emmaline Kluver, MD PGY-1, [facility] PTP Intern Pager [phone] 09/15/2012 1:22 PM

## 2012-09-15 NOTE — Progress Notes (Signed)
RT note: Pt. want's to hold CPT with Vest this a.m., will plan on therapy again around noon.

## 2012-09-15 NOTE — Progress Notes (Signed)
PGY-1 Update Note  Reassessed around 1730. Pt febrile to 100.9 and still not feeling well. Clinical appearance/exam mostly unchanged. Pt and mother not comfortable going home at this point. Will plan to observe overnight with goal discharge tomorrow, provided pt does well with Levaquin PO. Otherwise management per previous progress note from today.  Aside: Pt set up for f/u with Dr. Sharen Counter at [facility] on 4/28. Mother will complete ROI form for pt to transition to care with me at [place], appointment set for 5/19.  Emmaline Kluver, MD PGY-1, [place] Intern pager: [phone]

## 2012-09-15 NOTE — Care Management Note (Unsigned)
    Page 1 of 1   09/15/2012     3:30:25 PM   CARE MANAGEMENT NOTE 09/15/2012  Patient:  Jeremy Camacho, Jeremy Camacho   Account Number:  [id number]  Date Initiated:  09/15/2012  Documentation initiated by:  CRAFT,TERRI  Subjective/Objective Assessment:   22 year old male admitted 09/13/12 with a cough, has MD     Action/Plan:   D/C when medically stable   Anticipated DC Date:  09/16/2012   Anticipated DC Plan:  [place]  CM consult                Status of service:  In process, will continue to follow  Per UR Regulation:  Reviewed for med. necessity/level of care/duration of stay  Comments:  09/15/12, Terri Craft RNC-MNN, BSN, CM received referral. CM met with pt's mother concerning DME.  Pt's mother states that a cough assist device has been ordered through the MD clinic and has been arranged through [facility].

## 2012-09-16 MED ORDER — LEVOFLOXACIN 500 MG PO TABS
500.0000 mg | ORAL_TABLET | Freq: Every day | ORAL | Status: DC
  Administered 2012-09-16: 500 mg via ORAL
  Filled 2012-09-16 (×2): qty 1

## 2012-09-16 MED ORDER — LEVOFLOXACIN 25 MG/ML PO SOLN: 500.0000 mg | Freq: Every day | ORAL | Status: AC

## 2012-10-05 ENCOUNTER — Telehealth (HOSPITAL_COMMUNITY): Payer: Self-pay | Admitting: Emergency Medicine

## 2012-10-05 ENCOUNTER — Emergency Department (HOSPITAL_COMMUNITY)
Admission: EM | Admit: 2012-10-05 | Discharge: 2012-10-05 | Disposition: A | Discharge status: Home / Self Care | Payer: Medicaid Other | Attending: Emergency Medicine | Admitting: Emergency Medicine

## 2012-10-05 ENCOUNTER — Encounter (HOSPITAL_COMMUNITY): Payer: Self-pay

## 2012-10-05 ENCOUNTER — Emergency Department (HOSPITAL_COMMUNITY): Payer: Medicaid Other

## 2012-10-05 DIAGNOSIS — W050XXA Fall from non-moving wheelchair, initial encounter: Secondary | ICD-10-CM | POA: Insufficient documentation

## 2012-10-05 DIAGNOSIS — Y939 Activity, unspecified: Secondary | ICD-10-CM | POA: Insufficient documentation

## 2012-10-05 DIAGNOSIS — G7109 Other specified muscular dystrophies: Secondary | ICD-10-CM | POA: Insufficient documentation

## 2012-10-05 DIAGNOSIS — Y9289 Other specified places as the place of occurrence of the external cause: Secondary | ICD-10-CM | POA: Insufficient documentation

## 2012-10-05 DIAGNOSIS — G7100 Muscular dystrophy, unspecified: Secondary | ICD-10-CM

## 2012-10-05 DIAGNOSIS — M25562 Pain in left knee: Secondary | ICD-10-CM

## 2012-10-05 MED ORDER — HYDROCODONE-ACETAMINOPHEN 5-325 MG PO TABS: 1.0000 | ORAL_TABLET | Freq: Four times a day (QID) | ORAL | Status: DC | PRN

## 2012-10-05 MED ORDER — HYDROCODONE-ACETAMINOPHEN 7.5-325 MG/15ML PO SOLN: 15.0000 mL | Freq: Three times a day (TID) | ORAL | Status: DC | PRN
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
## 2012-10-05 NOTE — Telephone Encounter (Signed)
Pharmacy called to verify prescription was correct and it was correct.

## 2012-10-05 NOTE — ED Provider Notes (Signed)
History    This chart was scribed for Cleatrice Burke (PA) non-physician practitioner working with Carmin Muskrat, MD by Kathreen Cornfield, ED Scribe. This patient was seen in room TR06C/TR06C and the patient's care was started at 5:30Pm.    CSN: [id number]  Arrival date & time 10/05/12  1633   First MD Initiated Contact with Patient 10/05/12 1730      Chief Complaint  Patient presents with  . Fall    (Consider location/radiation/quality/duration/timing/severity/associated sxs/prior treatment) The history is provided by the patient and a parent. Jeremy language interpreter was used.   Jeremy Camacho is a 22 y.o. male , with a hx of muscular dystrophy, back surgery, joint replacement, and abdominal surgery, who presents to the Emergency Department complaining of sudden, moderate, fall, onset four days ago (10/01/12).  Associated symptoms include an abrasion located laterally to the right eye and left knee pain and swelling. The pt's mother reports the pt experienced a fall four days ago where he impacted upon his left knee and right side of his face. The pt has applied a cold compress, ace bandage, and taken ibuprofen, all of which do not provide relief of the associated left knee pain. Modifying factors include application of pressure upon the left knee which intensifies the associated knee pain.  The pt denies experiencing any other injury during the fall.   The pt does not smoke or drink alcohol.   PCP is Dr. Venetia Maxon.     Past Medical History  Diagnosis Date  . Muscular dystrophy   . Duchenne muscular dystrophy     Past Surgical History  Procedure Laterality Date  . Back surgery    . Joint replacement    . Abdominal surgery      Jeremy family history on file.  History  Substance Use Topics  . Smoking status: Passive Smoke Exposure - Never Smoker  . Smokeless tobacco: Not on file  . Alcohol Use: Jeremy      Review of Systems  Musculoskeletal: Positive for arthralgias.  All other  systems reviewed and are negative.    Allergies  Review of patient's allergies indicates Jeremy known allergies.  Home Medications   Current Outpatient Rx  Name  Route  Sig  Dispense  Refill  . ibuprofen (ADVIL,MOTRIN) 100 MG/5ML suspension   Oral   Take 400 mg by mouth every 4 (four) hours as needed for fever.           BP 92/60  Pulse 62  Temp(Src) 97.9 F (36.6 C) (Oral)  Resp 18  SpO2 100%  Physical Exam  Nursing note and vitals reviewed. Constitutional: He is oriented to person, place, and time. He appears well-developed and well-nourished. Jeremy distress.  HENT:  Head: Normocephalic and atraumatic.  Right Ear: External ear normal.  Left Ear: External ear normal.  Nose: Nose normal.  Eyes: Conjunctivae are normal.  Neck: Normal range of motion. Jeremy tracheal deviation present.  Cardiovascular: Normal rate, regular rhythm and normal heart sounds.   Pulmonary/Chest: Effort normal and breath sounds normal. Jeremy stridor.  Abdominal: Soft. He exhibits Jeremy distension. There is Jeremy tenderness.  Musculoskeletal: Normal range of motion.  Extremities show significant atrophy, limited ROM  Left knee swollen and tender to palpation   Neurological: He is alert and oriented to person, place, and time. He displays atrophy. He exhibits abnormal muscle tone.  Skin: Skin is warm and dry. He is not diaphoretic.  Psychiatric: He has a normal mood and affect. His behavior is  normal.    ED Course  Procedures (including critical care time)  DIAGNOSTIC STUDIES: Oxygen Saturation is 100% on room air, normal by my interpretation.    COORDINATION OF CARE:   6:12 PM- Treatment plan discussed with patient and pt's mother. Pt, as well as pt's mother agree with treatment.  7:42 PM- Recheck. Treatment plan concerning radiology results discussed with patient and pt's mother. Pt as well as pt's mother agree with treatment.        Labs Reviewed - Jeremy data to display    Dg Knee Complete 4  Views Left  10/05/2012   *RADIOLOGY REPORT*  Clinical Data: Golden Circle 5 days ago.  Wheelchair bound.  LEFT KNEE - COMPLETE 4+ VIEW  Comparison: 03/12/2010.  Findings: Osteopenia (limits detection for subtle abnormality). Unable to straighten the leg as on prior exam.  Jeremy fracture or dislocation.  IMPRESSION: Jeremy fracture or dislocation.  Please see above.   Original Report Authenticated By: Genia Del, M.D.      1. Muscular dystrophy   2. Knee pain, acute, left       MDM  Patient is a 22 year old male with muscular dystrophy who presents today after falling off his wheelchair 4 days ago. He has complaints of knee pain and mother would like to make sure there is Jeremy fracture. XR negative for fracture. He has an appointment with his PCP tomorrow. Otherwise he is at his baseline. Return instructions given. Discussed RICE. Vital signs stable for discharge. Patient / Family / Caregiver informed of clinical course, understand medical decision-making process, and agree with plan.      I personally performed the services described in this documentation, which was scribed in my presence. The recorded information has been reviewed and is accurate.     Elwyn Lade, PA-C 10/06/12 [phone]

## 2012-10-07 NOTE — ED Provider Notes (Signed)
  Medical screening examination/treatment/procedure(s) were performed by non-physician practitioner and as supervising physician I was immediately available for consultation/collaboration.    Carmin Muskrat, MD 10/07/12 [phone]

## 2012-10-09 ENCOUNTER — Ambulatory Visit (INDEPENDENT_AMBULATORY_CARE_PROVIDER_SITE_OTHER): Payer: Medicaid Other | Admitting: Family Medicine

## 2012-10-09 ENCOUNTER — Encounter: Payer: Self-pay | Admitting: Family Medicine

## 2012-10-09 VITALS — BP 110/70 | HR 75 | Temp 97.7°F

## 2012-10-09 DIAGNOSIS — K219 Gastro-esophageal reflux disease without esophagitis: Secondary | ICD-10-CM

## 2012-10-09 DIAGNOSIS — G7109 Other specified muscular dystrophies: Secondary | ICD-10-CM

## 2012-10-09 DIAGNOSIS — G7100 Muscular dystrophy, unspecified: Secondary | ICD-10-CM

## 2012-10-09 DIAGNOSIS — J302 Other seasonal allergic rhinitis: Secondary | ICD-10-CM

## 2012-10-09 DIAGNOSIS — J309 Allergic rhinitis, unspecified: Secondary | ICD-10-CM

## 2012-10-09 NOTE — Patient Instructions (Signed)
Thank you for coming in, today! I'm glad you're feeling better from when you were in the hospital. For your pain after the fall:  Keep taking the ibuprofen or Hycodan as needed.  Keep using the wrap and "RICE" measures as needed.  If the pain isn't getting better by the next week or two, call me back. Make sure you keep your appointments with everyone.  Try to see Dr. Sharen Counter one more time. If she needs to send me anything, please have her fax me or call the clinic.  I will call Dr. Vallarie Mare and touch base with him.  Please have the pulmonology and cardiology doctors fax me the records from Jareb's visits. I think every 6 months should be good for follow-up. If you need me before then, don't hesitate to come back sooner. Please feel free to call with any questions or concerns at any time, at [phone]. Our fax number is [phone]. --Dr. Venetia Maxon

## 2012-10-12 ENCOUNTER — Telehealth: Payer: Self-pay | Admitting: Family Medicine

## 2012-10-12 ENCOUNTER — Other Ambulatory Visit: Payer: Self-pay | Admitting: Family Medicine

## 2012-10-12 ENCOUNTER — Encounter: Payer: Self-pay | Admitting: Family Medicine

## 2012-10-12 DIAGNOSIS — J302 Other seasonal allergic rhinitis: Secondary | ICD-10-CM | POA: Insufficient documentation

## 2012-10-12 DIAGNOSIS — K219 Gastro-esophageal reflux disease without esophagitis: Secondary | ICD-10-CM | POA: Insufficient documentation

## 2012-10-12 MED ORDER — POLYETHYLENE GLYCOL 3350 17 GM/SCOOP PO POWD: 17.0000 g | Freq: Two times a day (BID) | ORAL | Status: DC | PRN

## 2012-10-12 NOTE — Assessment & Plan Note (Signed)
A: At baseline MSK/neurologic functioning, wheelchair-bound with home health equipment arranged by MD clinic at [facility]. Followed by Dr. Vallarie Mare at MD clinic.  P: No changes to medications. Will contact Dr. Vallarie Mare to inform him of pt's PCP change and to see if he has any recommendations or other things our clinic should know about Jarrett's care. Will also f/u on records from cardiology and pulmonology.

## 2012-10-12 NOTE — Assessment & Plan Note (Signed)
Well-controlled with Zyrtec syrup. Continue current dose/frequency. F/u as needed.

## 2012-10-12 NOTE — Assessment & Plan Note (Signed)
Well-controlled with Ranitidine syrup. Continue current dose/frequency. F/u as needed.

## 2012-10-12 NOTE — Telephone Encounter (Signed)
Mother called. Pt has not been to the bathroom (BM) in two days. Wants to know what she can give him to make him go
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
## 2012-10-12 NOTE — Telephone Encounter (Signed)
Will fwd to MD for advice.  Jeremy Camacho, Jeremy Camacho, Jeremy Camacho

## 2012-10-12 NOTE — Telephone Encounter (Signed)
Please call mother back and tell her she can try Miralax, first. I will send in an e-script to her pharmacy for up to two times daily. Miralax does not work immediately and does not "make" you have a BM. If that doesn't help in a day or so, she can add Colace (over the counter, use as directed); Colace also does not cause BM's, but both medicines should help Jeremy Camacho have a BM more easily. If she tries either or both medicines and he has any LOOSE stools, stop the medications. If he is still having trouble through the weekend, have her call the emergency line or come into clinic on Monday (I personally am not here any next week, however). Thanks! --CMS

## 2012-12-12 ENCOUNTER — Other Ambulatory Visit: Payer: Self-pay | Admitting: Family Medicine

## 2012-12-15 ENCOUNTER — Other Ambulatory Visit: Payer: Self-pay | Admitting: Family Medicine

## 2012-12-21 ENCOUNTER — Encounter (HOSPITAL_COMMUNITY): Payer: Self-pay | Admitting: *Deleted

## 2012-12-21 ENCOUNTER — Emergency Department (HOSPITAL_COMMUNITY): Payer: Medicaid Other

## 2012-12-21 ENCOUNTER — Emergency Department (HOSPITAL_COMMUNITY)
Admission: EM | Admit: 2012-12-21 | Discharge: 2012-12-22 | Disposition: A | Discharge status: Home / Self Care | Payer: Medicaid Other | Attending: Emergency Medicine | Admitting: Emergency Medicine

## 2012-12-21 DIAGNOSIS — Y9289 Other specified places as the place of occurrence of the external cause: Secondary | ICD-10-CM | POA: Insufficient documentation

## 2012-12-21 DIAGNOSIS — Z79899 Other long term (current) drug therapy: Secondary | ICD-10-CM | POA: Insufficient documentation

## 2012-12-21 DIAGNOSIS — S5001XA Contusion of right elbow, initial encounter: Secondary | ICD-10-CM

## 2012-12-21 DIAGNOSIS — Z8669 Personal history of other diseases of the nervous system and sense organs: Secondary | ICD-10-CM | POA: Insufficient documentation

## 2012-12-21 DIAGNOSIS — W2209XA Striking against other stationary object, initial encounter: Secondary | ICD-10-CM | POA: Insufficient documentation

## 2012-12-21 DIAGNOSIS — Y9389 Activity, other specified: Secondary | ICD-10-CM | POA: Insufficient documentation

## 2012-12-21 DIAGNOSIS — K219 Gastro-esophageal reflux disease without esophagitis: Secondary | ICD-10-CM | POA: Insufficient documentation

## 2012-12-21 DIAGNOSIS — S5000XA Contusion of unspecified elbow, initial encounter: Secondary | ICD-10-CM | POA: Insufficient documentation
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
## 2012-12-21 NOTE — ED Provider Notes (Signed)
CSN: [id number]     Arrival date & time 12/21/12  2152 History     First MD Initiated Contact with Patient 12/21/12 2210     Chief Complaint  Patient presents with  . Elbow Injury   (Consider location/radiation/quality/duration/timing/severity/associated sxs/prior Treatment) HPI Comments: 22 y.o. Male with PMHx of muscular dystrophy presents today with mother complaining of right elbow pain. Acute onset Sunday night when he was backing his wheelchair up the hydraulic van ramp and banged it into the metal support. Pt states pain was 5/10 at the time of injury and is 2/10 now.    Past Medical History  Diagnosis Date  . Muscular dystrophy   . Duchenne muscular dystrophy   . Allergy     Seasonal  . GERD (gastroesophageal reflux disease)    Past Surgical History  Procedure Laterality Date  . Back surgery    . Joint replacement    . Abdominal surgery    . Gastrostomy tube placement      Placed ~2009. Not used since 2011.   Family History  Problem Relation Age of Onset  . Diabetes Maternal Grandfather   . Hypertension Maternal Grandfather    History  Substance Use Topics  . Smoking status: Passive Smoke Exposure - Never Smoker  . Smokeless tobacco: Not on file  . Alcohol Use: No    Review of Systems  Constitutional: Negative for fever.  HENT: Negative for neck pain and neck stiffness.   Eyes: Negative for redness.  Respiratory: Negative for apnea, chest tightness and shortness of breath.   Cardiovascular: Negative for chest pain and palpitations.  Gastrointestinal: Negative for vomiting.  Genitourinary: Negative for dysuria.  Musculoskeletal: Positive for arthralgias.       Right elbow  Skin: Negative for color change.  Allergic/Immunologic: Positive for immunocompromised state.  Neurological: Negative for dizziness, weakness, light-headedness, numbness and headaches.    Allergies  Review of patient's allergies indicates no known allergies.  Home Medications    Current Outpatient Rx  Name  Route  Sig  Dispense  Refill  . cetirizine (ZYRTEC) 1 MG/ML syrup   Oral   Take 5 mg by mouth at bedtime.         Marland Kitchen HYDROcodone-acetaminophen (HYCET) 7.5-325 mg/15 ml solution   Oral   Take 15 mLs by mouth every 8 (eight) hours as needed for pain.   120 mL   0   . ibuprofen (ADVIL,MOTRIN) 100 MG/5ML suspension   Oral   Take 400 mg by mouth every 4 (four) hours as needed for fever.         . ranitidine (ZANTAC) 15 MG/ML syrup   Oral   Take 30 mg by mouth at bedtime.          BP 110/81  Pulse 88  Temp(Src) 97.3 F (36.3 C) (Oral)  Resp 20  Wt 79 lb (35.834 kg)  BMI 12.76 kg/m2  SpO2 98% Physical Exam  Nursing note and vitals reviewed. Constitutional: He is oriented to person, place, and time. No distress.  Thin, frail looking  HENT:  Head: Normocephalic and atraumatic.  Eyes: Conjunctivae and EOM are normal.  Neck: Normal range of motion. Neck supple.  No meningeal signs  Cardiovascular: Normal rate, regular rhythm, normal heart sounds and intact distal pulses.  Exam reveals no gallop and no friction rub.   No murmur heard. Pulmonary/Chest: Effort normal and breath sounds normal. No respiratory distress. He has no wheezes. He has no rales. He exhibits no tenderness.  Abdominal: Soft. There is no tenderness.  Musculoskeletal: Normal range of motion. He exhibits edema and tenderness.  Pt has limited ROM of upper and lower extremities d/t hx of muscular dystrophy. Pt states he has had decreased ROM at the right elbow secondary to pain. States he is at his baseline for ROM at the wrist, digits of right hand, and right shoulder. Slight swelling noted to right elbow. Tender to palpation. No bruising or ecchymosis.    Neurological: He is alert and oriented to person, place, and time. No cranial nerve deficit.  Speech is clear and goal oriented, follows commands Sensation normal to light touch and two point discrimination Baselibne strength  in upper and lower extremities bilaterally including dorsiflexion and plantar flexion Baseline grip strength   Skin: Skin is warm and dry. He is not diaphoretic. No erythema.  Psychiatric: He has a normal mood and affect.    ED Course   Procedures (including critical care time)  Labs Reviewed - No data to display Dg Elbow Complete Right  12/21/2012   *RADIOLOGY REPORT*  Clinical Data: Right elbow injury.  Twisted elbow.  RIGHT ELBOW - COMPLETE 3+ VIEW  Comparison: None.  Findings: Nonstandard projections are submitted.  The patient was unable to appropriately position the elbow due to muscular dystrophy.  There is no acute osseous abnormality identified. Diffuse osteopenia.  Effusion is difficult to assess but there is no gross effusion identified.  IMPRESSION: Nonstandard projections.  No acute osseous abnormality identified.   Original Report Authenticated By: Dereck Ligas, M.D.   1. Contusion of right elbow, initial encounter     MDM  Mother is concerned for elbow fracture d/t the fragility of the pt and the fact that pt continues to complain of pain. Will get xrays of complete elbow and re-evaluate.   Imaging shows no acute osseous abnormality. Will treat as contusion and discharge home. Discussed reasons to seek immediate care. Mother expresses understanding and agrees with plan.   Coralee North, PA-C 12/22/12 [phone]

## 2012-12-21 NOTE — ED Notes (Signed)
Pt did not have an iv present on arrival.

## 2012-12-22 MED ORDER — IBUPROFEN 100 MG/5ML PO SUSP: 400.0000 mg | ORAL | Status: DC | PRN

## 2012-12-22 NOTE — ED Provider Notes (Signed)
Medical screening examination/treatment/procedure(s) were performed by non-physician practitioner and as supervising physician I was immediately available for consultation/collaboration.  Avie Arenas, MD 12/22/12 [phone]

## 2013-01-24 ENCOUNTER — Other Ambulatory Visit: Payer: Self-pay | Admitting: Family Medicine

## 2013-02-08 ENCOUNTER — Ambulatory Visit: Payer: Medicaid Other | Admitting: Family Medicine

## 2013-03-05 ENCOUNTER — Other Ambulatory Visit: Payer: Self-pay | Admitting: Family Medicine

## 2013-03-07 ENCOUNTER — Encounter: Payer: Self-pay | Admitting: Family Medicine

## 2013-03-07 ENCOUNTER — Ambulatory Visit (INDEPENDENT_AMBULATORY_CARE_PROVIDER_SITE_OTHER): Payer: Medicaid Other | Admitting: Family Medicine

## 2013-03-07 VITALS — BP 95/74 | HR 88 | Temp 98.5°F

## 2013-03-07 DIAGNOSIS — L97511 Non-pressure chronic ulcer of other part of right foot limited to breakdown of skin: Secondary | ICD-10-CM | POA: Insufficient documentation

## 2013-03-07 DIAGNOSIS — J302 Other seasonal allergic rhinitis: Secondary | ICD-10-CM

## 2013-03-07 DIAGNOSIS — K219 Gastro-esophageal reflux disease without esophagitis: Secondary | ICD-10-CM

## 2013-03-07 DIAGNOSIS — Z23 Encounter for immunization: Secondary | ICD-10-CM

## 2013-03-07 DIAGNOSIS — G7100 Muscular dystrophy, unspecified: Secondary | ICD-10-CM

## 2013-03-07 DIAGNOSIS — J309 Allergic rhinitis, unspecified: Secondary | ICD-10-CM

## 2013-03-07 DIAGNOSIS — G7109 Other specified muscular dystrophies: Secondary | ICD-10-CM

## 2013-03-07 DIAGNOSIS — K5900 Constipation, unspecified: Secondary | ICD-10-CM | POA: Insufficient documentation

## 2013-03-07 DIAGNOSIS — L97509 Non-pressure chronic ulcer of other part of unspecified foot with unspecified severity: Secondary | ICD-10-CM

## 2013-03-07 MED ORDER — CEPHALEXIN 250 MG/5ML PO SUSR: 500.0000 mg | Freq: Four times a day (QID) | ORAL | Status: DC

## 2013-03-07 NOTE — Progress Notes (Signed)
  Subjective:    Patient ID: Jeremy Camacho, male    DOB: 1990-10-13, 22 y.o.   MRN: [id number]  HPI: Pt presents to clinic for yearly visit. Chronic issues include muscular dystrophy, GERD, seasonal allergies. Acute complaints of right toe ulcer and constipation, but generally feels well.  MD - followed by MD clinic in [place] by Dr. Vallarie Mare, last visit early this year -no new complaints / change in functional status (wheelchair-bound at baseline), no new medications -has been referred to pulmonology and cardiology by Dr. Carole Civil, with pending f/u with pulm for sleep study; has not yet seen cardiology -supposed to be on CPAP at home at nighttime, but not using regularly (complains of it "taking his breath,") with plan to discuss with pulm, per mother  Seasonal allergies - well-controlled with Zyrtec, denies sneezing/coughing, coryza-type symptoms, difficulty breathing, rash  GERD - well-controlled with Zantac; denies frank reflux symptoms currently -has PEG tube in place, but has not used in several years -does well with liquids and solid foods, but does not tolerate pill medications well (uses solutions, typically)  Great toe ulcer on right - present for about 1 week, with hx of similar lesions in the past (right second toe tends to rub against great toe) -similar lesion present during hospitalization in April, improved on Levaquin for PNA at that time -pt attributes ulceration to "wearing socks too long at a time" and right second toe rubbing against nail fold -has taken no medications, mother only using peroxide and soap/water to clean the area -denies fever / chills, N/V; area is sore but not frankly painful, has had some scant bloody/purulent drainage  Constipation - worse for the past month, some hard/small stools and some painful stools -much improved with Miralax, and has been "more regular" lately, taking Miralax infrequently (not every day, but many days) -denies blood in stool,  current abdominal pain  Pt is a never smoker. In addition to the above documentation, pt's PMH, surgical history, FH, and SH all reviewed and updated where appropriate in the EMR. I have also reviewed and updated the pt's allergies and current medications as appropriate.   Review of Systems: As above. Otherwise, full 12-system ROS was reviewed and all negative.      Objective:   Physical Exam BP 95/74  Pulse 88  Temp(Src) 98.5 F (36.9 C) (Oral)  Gen: chronically ill and very frail-appearing young adult male in NAD; wheelchair-bound, very emaciated but at baseline appearance  HEENT: [place]/AT, sclerae/conjunctivae clear, no lid lag, EOMI, PERRLA   MMM, posterior oropharynx clear, no cervical lymphadenopathy   neck with no masses appreciated; thyroid not enlarged  Cardio: RRR, no murmur appreciated; distal pulses intact/symmetric  Pulm: CTAB, no wheezes, normal WOB  Abd: soft, nondistended, BS+, no HSM; G-tube in place without surrounding erythema/edema  Ext: warm/well-perfused, no cyanosis/clubbing/edema; very thin extremities with little muscle mass, especially in LE bilaterally   Right great toe with ulceration along lateral nail fold, scant dried blood/drainage overlying reddened skin  No discreet area of fluctuance and no expressible drainage, though ulcer is very tender to the touch MSK: numerous contractures most pronounced in LE; able to move UE some, mostly hands, at baseline mobility  Neuro/Psych: alert/oriented, mood euthymic with congruent affect     Assessment & Plan:

## 2013-03-07 NOTE — Assessment & Plan Note (Signed)
Well-controlled with Zantac. Continue, f/u as needed.

## 2013-03-07 NOTE — Assessment & Plan Note (Signed)
No frank cellulitic component or abscess, but ulceration along nail fold does appear infected. Rx for Keflex for 7 days, with strict return precautions discussed. Advised good local care with soap/water. Avoid peroxide to prevent tissue destruction. Also advised separating toes with clean gauze to prevent rubbing / further skin breakdown. F/u as needed.

## 2013-03-07 NOTE — Patient Instructions (Signed)
Thank you for coming in, today!  Jeremy Camacho looks well today. Make sure to keep your appoints with pulmonology, cardiology, and Dr. Vallarie Mare. Ask Dr. Vallarie Mare about if / when his PEG needs to be replaced. When he sees the lung and heart doctors, ask them to send me their notes.  For his toe:  I will prescribe an antibiotic called Keflex to take 4 times per day, for 7 days.  Keep the area on his toe clean with soap and water.  Use gauze to keep the toes separated (even after it gets better). If his toe gets worse instead of better, or if he has systemic symptoms (fever/nausea/etc), call or come back sooner, or go to the ED.  For constipation:  Keep using Miralax. Try scheduling it every day.  You can also add Colace (on top of the Miralax)  Neither one of these make him go, but will make it easier.  If he has loose stools, stop taking them for 1-2 days.  If he keeps having problems, we can consider having GI see him.  Please feel free to call with any questions or concerns at any time, at [phone]. --Dr. Venetia Maxon

## 2013-03-07 NOTE — Assessment & Plan Note (Signed)
A: At baseline functioning, wheelchair-bound with home health equipment from MD clinic at [facility]. Primary MD doctor is Dr. Vallarie Mare. Being evaluated per MD clinic routine by pulm and cards. Admits non-compliance some nights with CPAP.  P: No changes to medications / regimen, today. To f/u with Dr. Vallarie Mare. Requested mother to ask specialist to forward notes to our clinic. F/u PRN.

## 2013-03-07 NOTE — Assessment & Plan Note (Signed)
Uncertain if related to acute illness (?gastroenteritis, etc) or chronic illness (muscular dystrophy). Improved with Miralax. Advised continued Miralax +/- addition of Colace (both OTC), good hydration, fiber in diet. Follow-up PRN. Consider referral to GI if acutely worsened or if becomes complicated.

## 2013-03-07 NOTE — Assessment & Plan Note (Signed)
Well-controlled with Zyrtec syrup. Continue, f/u as needed.

## 2013-03-23 ENCOUNTER — Other Ambulatory Visit: Payer: Self-pay | Admitting: Family Medicine

## 2013-03-23 ENCOUNTER — Ambulatory Visit (INDEPENDENT_AMBULATORY_CARE_PROVIDER_SITE_OTHER): Payer: Medicaid Other | Admitting: Family Medicine

## 2013-03-23 ENCOUNTER — Encounter: Payer: Self-pay | Admitting: Family Medicine

## 2013-03-23 ENCOUNTER — Ambulatory Visit
Admission: RE | Admit: 2013-03-23 | Discharge: 2013-03-23 | Disposition: A | Discharge status: Home / Self Care | Payer: Medicaid Other | Source: Ambulatory Visit | Attending: Family Medicine | Admitting: Family Medicine

## 2013-03-23 VITALS — BP 111/81 | HR 101 | Temp 98.5°F

## 2013-03-23 DIAGNOSIS — R0989 Other specified symptoms and signs involving the circulatory and respiratory systems: Secondary | ICD-10-CM

## 2013-03-23 DIAGNOSIS — L97511 Non-pressure chronic ulcer of other part of right foot limited to breakdown of skin: Secondary | ICD-10-CM

## 2013-03-23 DIAGNOSIS — L97509 Non-pressure chronic ulcer of other part of unspecified foot with unspecified severity: Secondary | ICD-10-CM

## 2013-03-23 DIAGNOSIS — J189 Pneumonia, unspecified organism: Secondary | ICD-10-CM

## 2013-03-23 MED ORDER — LEVOFLOXACIN 25 MG/ML PO SOLN: 500.0000 mg | Freq: Every day | ORAL | Status: DC

## 2013-03-23 MED ORDER — CEPHALEXIN 250 MG/5ML PO SUSR: 500.0000 mg | Freq: Three times a day (TID) | ORAL | Status: DC

## 2013-03-23 MED ORDER — IBUPROFEN 100 MG/5ML PO SUSP: 400.0000 mg | ORAL | Status: DC | PRN

## 2013-03-23 NOTE — Progress Notes (Signed)
Patient ID: Jeremy Camacho    DOB: 06-10-1990, 22 y.o.   MRN: [id number] --- Subjective:  Jeremy Camacho is a 22 y.o.male with h/o muscular dystrophy who presents as same day appointment with chest congestion.  - chest congestion: started on Monday with sore throat and rhinorrhea. Started having productive cough of yellow sputum, associated with subjective fever. Symptoms became worst yesterday. Better today. He denies any significant shortness of breath currently, although did feel short winded yesterday. No chest pain. He has a history of community acquired pneumonia for which he was hospitalized in April 2014.   - follow up on left great toe sore: has been on keflex for 1 week, finished course yesterday. He reports that it has improved since it started. There has been development of a scab with some serosanguinous drainage. He has a little soreness around the great toe. His mother has been cleaning it with soap and water every day and keeping it clean and dry.   ROS: see HPI Past Medical History: reviewed and updated medications and allergies. Social History: Tobacco: none  Objective: Filed Vitals:   03/23/13 0957  BP: 111/81  Pulse: 101  Temp: 98.5 F (36.9 C)    Physical Examination:   General appearance - alert, well appearing, and in no distress Ears - right TM obstructed by wax, left TM normal Nose - erythematous and congested nasal turbinates bilaterally Mouth - mucous membranes moist, pharynx difficult to completely visualize but oropharynx visualized looks normal.  Neck - supple, right cervical lymph node, non tender  Chest - clear to auscultation, without rhales or rhonchi on anterior exam Heart - S1S2, tachycardic in the 90's, regular rhythm, no murmur Abdomen - soft, nontender Extremities - skin: left great toe: scab on medial aspect of left great toe with serosanguinous drainage, no surrounding redness or warmth, mild tenderness to palpation.

## 2013-03-23 NOTE — Patient Instructions (Signed)
I will call you with the chest xray results.

## 2013-03-24 DIAGNOSIS — J189 Pneumonia, unspecified organism: Secondary | ICD-10-CM | POA: Insufficient documentation

## 2013-03-24 NOTE — Assessment & Plan Note (Signed)
In light of patient's cough and h/o hospitalization for community acquired pneumonia, obtained chest xray which showed a focal area of consolidation in the left lung base.  - called his mother and reported findings of chest xray.  - patient was well appearing, non dyspneic, warranting outpatient treatment with levaquin Will treat for 2 weeks.  - reviewed red flags for return to care to urgent care or ED over the weekend: worsening symptoms, not eating or drinking, difficulty breathing or persistent fever. Patient's mother expressed understanding.

## 2013-03-24 NOTE — Assessment & Plan Note (Signed)
Reportedly improved since onset of symptoms. Since there still is some drainage and tenderness, will extend length of antibiotic coverage.  Levaquin used for pneumonia will cover skin infection as well.

## 2013-03-27 ENCOUNTER — Telehealth: Payer: Self-pay | Admitting: Family Medicine

## 2013-03-27 MED ORDER — LEVOFLOXACIN 500 MG PO TABS: 500.0000 mg | ORAL_TABLET | Freq: Every day | ORAL | Status: DC

## 2013-03-27 NOTE — Telephone Encounter (Signed)
Mother says the pharmacy says medicaid will not pay for the liquid form of levaquin. Pt cannot take pill. Please advise

## 2013-03-27 NOTE — Telephone Encounter (Signed)
Will fwd. To Dr.Losq for review (maybe pills can be crushed?) .Jeremy Camacho

## 2013-03-27 NOTE — Telephone Encounter (Signed)
Called and left message. If patient is able to crush pill and take it in apple sause, this is going to be the quickest option for him to start taking medication. Sent Rx for levaquin tab to be crushed. Recommended on message that if Mom doesn't think this is going to work, she can call the clinic and let us know and I will try getting prior approval for the liquid.   Liam Graham, PGY-3 Family Medicine Resident

## 2013-03-28 ENCOUNTER — Encounter (HOSPITAL_COMMUNITY): Payer: Self-pay | Admitting: Emergency Medicine

## 2013-03-28 ENCOUNTER — Emergency Department (HOSPITAL_COMMUNITY)
Admission: EM | Admit: 2013-03-28 | Discharge: 2013-03-28 | Disposition: A | Discharge status: Home / Self Care | Payer: Medicaid Other | Attending: Emergency Medicine | Admitting: Emergency Medicine

## 2013-03-28 DIAGNOSIS — Z79899 Other long term (current) drug therapy: Secondary | ICD-10-CM | POA: Insufficient documentation

## 2013-03-28 DIAGNOSIS — Z8669 Personal history of other diseases of the nervous system and sense organs: Secondary | ICD-10-CM | POA: Insufficient documentation

## 2013-03-28 DIAGNOSIS — J189 Pneumonia, unspecified organism: Secondary | ICD-10-CM

## 2013-03-28 DIAGNOSIS — K219 Gastro-esophageal reflux disease without esophagitis: Secondary | ICD-10-CM | POA: Insufficient documentation

## 2013-03-28 DIAGNOSIS — J159 Unspecified bacterial pneumonia: Secondary | ICD-10-CM | POA: Insufficient documentation

## 2013-03-28 MED ORDER — DOXYCYCLINE CALCIUM 50 MG/5ML PO SYRP: 100.0000 mg | ORAL_SOLUTION | Freq: Two times a day (BID) | ORAL | Status: AC

## 2013-03-28 MED ORDER — DOXYCYCLINE CALCIUM 50 MG/5ML PO SYRP
100.0000 mg | ORAL_SOLUTION | Freq: Two times a day (BID) | ORAL | Status: DC
  Administered 2013-03-28: 100 mg
  Filled 2013-03-28 (×2): qty 10

## 2013-03-28 NOTE — ED Provider Notes (Signed)
Medical screening examination/treatment/procedure(s) were performed by non-physician practitioner and as supervising physician I was immediately available for consultation/collaboration.  EKG Interpretation   None         Mervin Kung, MD 03/28/13 1304

## 2013-03-28 NOTE — Progress Notes (Signed)
Late Entry ED CM consulted by [facility] in [place] in regards to medication issue.  In to speak with patient's mom. Pt  Has Muscular dystrophy.  Pt had been seen by PCP on Friday and was given a prescription for Levaquin liquid in which,  Mom was not able to afford. Patient is covered by Medicaid, Mom reports pharmacy told her that medicaid will not cover the antibiotic. Lawyer PA-C considered changing the antibiotic for a less expensive antibiotic. Contacted [facility] on [address] Pharmacist reports a Prior Authorization needed from [employer] . Contacted the medicaid prior authorization and obtained PA [id number] and called in to [facility]. Discussed the  plan with Lawyer PA-C and Su Grand. Explained to mom that the prior auth was obtained for the liquid levaquin. Mom appreciative and agrees with plan. Discussed the CAPS program and encouraged mom to contact her DSS worker more information. No further CM needs identified

## 2013-03-28 NOTE — ED Notes (Signed)
Pt c/o being diagnosed with PNA on Friday; family said unable to afford his antibiotics; pt in electric wheelchair; pt sts cough and congestion; no distress noted; denies fever

## 2013-03-28 NOTE — ED Notes (Signed)
Case Worker here to assist pt in getting meds

## 2013-03-28 NOTE — ED Provider Notes (Signed)
CSN: [id number]     Arrival date & time 03/28/13  1233 History   First MD Initiated Contact with Patient 03/28/13 1244     Chief Complaint  Patient presents with  . Cough   (Consider location/radiation/quality/duration/timing/severity/associated sxs/prior Treatment) HPI Patient presents to the emergency department with a diagnosis of pneumonia.  The patient was seen by his primary care doctor last Friday and diagnosed with pneumonia.  The mother, states that she was unable to afford the antibiotic.  Patient's had no worsening in his condition, but just not able to take the medication due to cost with patient and mother deny nausea, vomiting, diarrhea, lethargy, weakness, or syncope. Past Medical History  Diagnosis Date  . Muscular dystrophy   . Duchenne muscular dystrophy   . Allergy     Seasonal  . GERD (gastroesophageal reflux disease)    Past Surgical History  Procedure Laterality Date  . Back surgery    . Joint replacement    . Abdominal surgery    . Gastrostomy tube placement      Placed ~2009. Not used since 2011.   Family History  Problem Relation Age of Onset  . Diabetes Maternal Grandfather   . Hypertension Maternal Grandfather    History  Substance Use Topics  . Smoking status: Passive Smoke Exposure - Never Smoker  . Smokeless tobacco: Not on file  . Alcohol Use: No    Review of Systems All other systems negative except as documented in the HPI. All pertinent positives and negatives as reviewed in the HPI. Allergies  Review of patient's allergies indicates no known allergies.  Home Medications   Current Outpatient Rx  Name  Route  Sig  Dispense  Refill  . cetirizine HCl (CETIRIZINE HCL CHILDRENS ALRGY) 5 MG/5ML SYRP   Oral   Take 5 mLs (5 mg total) by mouth daily. 5 mL = 1 teaspoon   118 mL   3   . ibuprofen (ADVIL,MOTRIN) 100 MG/5ML suspension   Oral   Take 20 mLs (400 mg total) by mouth every 4 (four) hours as needed for fever.   120 mL   1   .  Phenylephrine-DM-GG-APAP (MUCINEX CHILD MULTI-SYMPTOM PO)   Oral   Take 20 mLs by mouth every 4 (four) hours as needed (for cold symptoms).         . ranitidine (ZANTAC) 15 MG/ML syrup   Oral   Take 30 mg by mouth 2 (two) times daily.          BP 102/80  Pulse 77  Temp(Src) 97.5 F (36.4 C) (Oral)  Resp 18  SpO2 98% Physical Exam  Nursing note and vitals reviewed. Constitutional: He is oriented to person, place, and time. He appears well-developed and well-nourished.  HENT:  Head: Normocephalic and atraumatic.  Mouth/Throat: Oropharynx is clear and moist.  Eyes: Pupils are equal, round, and reactive to light.  Neck: Normal range of motion. Neck supple.  Cardiovascular: Normal rate, regular rhythm and normal heart sounds.  Exam reveals no gallop and no friction rub.   No murmur heard. Pulmonary/Chest: Effort normal and breath sounds normal. No respiratory distress.  Neurological: He is alert and oriented to person, place, and time.  Skin: Skin is warm and dry.    ED Course  Procedures (including critical care time) Will prescribe doxycycline liquid.  Told to followup with his primary care Dr. patient is in no distress and having no abnormalities and his vital signs    Brent General,  PA-C 03/28/13 1303

## 2013-03-28 NOTE — ED Notes (Signed)
Pt is in w/c and mom states that the med that pt was rx for pneu on Friday  They cannot afford, so they have come to er for assistance , pt has no c/o anything else. Case manger called to see pt. Pt does have peg tube but mom states that it has button it and that she has nnot put anything in it for a few years now. Mom staes she has been taking care of son by herself but she is looking to have a nurse came ou now that pt is 49

## 2013-05-25 ENCOUNTER — Other Ambulatory Visit: Payer: Self-pay | Admitting: Family Medicine

## 2013-09-12 ENCOUNTER — Other Ambulatory Visit: Payer: Self-pay | Admitting: Family Medicine

## 2013-10-04 ENCOUNTER — Other Ambulatory Visit: Payer: Self-pay | Admitting: Family Medicine

## 2013-10-29 ENCOUNTER — Ambulatory Visit: Payer: Medicaid Other

## 2013-12-14 ENCOUNTER — Other Ambulatory Visit: Payer: Self-pay | Admitting: Family Medicine

## 2014-01-21 ENCOUNTER — Other Ambulatory Visit: Payer: Self-pay | Admitting: Family Medicine

## 2014-01-30 ENCOUNTER — Telehealth: Payer: Self-pay | Admitting: Family Medicine

## 2014-01-30 NOTE — Telephone Encounter (Signed)
Received request to complete form for home aide for ADL's for pt due to his muscular dystrophy. Form requires that pt be seen within 90 days of completing it and sending it in. Called mother to let her know this; she will schedule an appointment for Eyob to see me at her earliest convenience, and I will complete and send in the form at that time. --CMS

## 2014-02-05 ENCOUNTER — Inpatient Hospital Stay (HOSPITAL_COMMUNITY)
Admission: EM | Admit: 2014-02-05 | Discharge: 2014-02-08 | DRG: 193 | Disposition: A | Discharge status: Home / Self Care | Payer: Medicaid Other | Attending: Family Medicine | Admitting: Family Medicine

## 2014-02-05 ENCOUNTER — Emergency Department (HOSPITAL_COMMUNITY): Payer: Medicaid Other

## 2014-02-05 ENCOUNTER — Encounter (HOSPITAL_COMMUNITY): Payer: Self-pay | Admitting: Emergency Medicine

## 2014-02-05 DIAGNOSIS — R000 Tachycardia, unspecified: Secondary | ICD-10-CM

## 2014-02-05 DIAGNOSIS — E43 Unspecified severe protein-calorie malnutrition: Secondary | ICD-10-CM | POA: Insufficient documentation

## 2014-02-05 DIAGNOSIS — G7109 Other specified muscular dystrophies: Secondary | ICD-10-CM | POA: Diagnosis present

## 2014-02-05 DIAGNOSIS — J302 Other seasonal allergic rhinitis: Secondary | ICD-10-CM

## 2014-02-05 DIAGNOSIS — R64 Cachexia: Secondary | ICD-10-CM | POA: Diagnosis present

## 2014-02-05 DIAGNOSIS — L97509 Non-pressure chronic ulcer of other part of unspecified foot with unspecified severity: Secondary | ICD-10-CM | POA: Diagnosis present

## 2014-02-05 DIAGNOSIS — K219 Gastro-esophageal reflux disease without esophagitis: Secondary | ICD-10-CM | POA: Diagnosis present

## 2014-02-05 DIAGNOSIS — J189 Pneumonia, unspecified organism: Secondary | ICD-10-CM | POA: Diagnosis present

## 2014-02-05 DIAGNOSIS — K2100 Gastro-esophageal reflux disease with esophagitis, without bleeding: Secondary | ICD-10-CM

## 2014-02-05 DIAGNOSIS — L97511 Non-pressure chronic ulcer of other part of right foot limited to breakdown of skin: Secondary | ICD-10-CM

## 2014-02-05 DIAGNOSIS — R0789 Other chest pain: Secondary | ICD-10-CM | POA: Diagnosis present

## 2014-02-05 DIAGNOSIS — Z681 Body mass index (BMI) 19 or less, adult: Secondary | ICD-10-CM | POA: Diagnosis not present

## 2014-02-05 DIAGNOSIS — G7100 Muscular dystrophy, unspecified: Secondary | ICD-10-CM

## 2014-02-05 DIAGNOSIS — J309 Allergic rhinitis, unspecified: Secondary | ICD-10-CM

## 2014-02-05 HISTORY — DX: Pneumonia, unspecified organism: J18.9

## 2014-02-05 HISTORY — DX: Cardiac murmur, unspecified: R01.1

## 2014-02-05 HISTORY — DX: Other seasonal allergic rhinitis: J30.2

## 2014-02-05 LAB — CBC WITH DIFFERENTIAL/PLATELET
BASOS PCT: 0 % (ref 0–1)
Basophils Absolute: 0 10*3/uL (ref 0.0–0.1)
Eosinophils Absolute: 0 10*3/uL (ref 0.0–0.7)
Eosinophils Relative: 1 % (ref 0–5)
HEMATOCRIT: 43.8 % (ref 39.0–52.0)
Hemoglobin: 14.2 g/dL (ref 13.0–17.0)
Lymphocytes Relative: 28 % (ref 12–46)
Lymphs Abs: 1 10*3/uL (ref 0.7–4.0)
MCH: 29.5 pg (ref 26.0–34.0)
MCHC: 32.4 g/dL (ref 30.0–36.0)
MCV: 90.9 fL (ref 78.0–100.0)
MONO ABS: 0.4 10*3/uL (ref 0.1–1.0)
Monocytes Relative: 11 % (ref 3–12)
NEUTROS PCT: 60 % (ref 43–77)
Neutro Abs: 2.1 10*3/uL (ref 1.7–7.7)
Platelets: 169 10*3/uL (ref 150–400)
RBC: 4.82 MIL/uL (ref 4.22–5.81)
RDW: 12.5 % (ref 11.5–15.5)
WBC: 3.5 10*3/uL — ABNORMAL LOW (ref 4.0–10.5)

## 2014-02-05 LAB — TROPONIN I: Troponin I: <0.3 ng/mL (ref <0.30)

## 2014-02-05 LAB — MAGNESIUM: Magnesium: 1.7 mg/dL (ref 1.5–2.5)

## 2014-02-05 LAB — BASIC METABOLIC PANEL
Anion gap: 13 (ref 5–15)
BUN: 10 mg/dL (ref 6–23)
CALCIUM: 9.5 mg/dL (ref 8.4–10.5)
CO2: 28 mEq/L (ref 19–32)
Chloride: 99 mEq/L (ref 96–112)
Creatinine, Ser: <0.2 mg/dL — ABNORMAL LOW (ref 0.50–1.35)
GLUCOSE: 140 mg/dL — AB (ref 70–99)
Potassium: 4.2 mEq/L (ref 3.7–5.3)
Sodium: 140 mEq/L (ref 137–147)

## 2014-02-05 LAB — TSH: TSH: 2.16 u[IU]/mL (ref 0.350–4.500)

## 2014-02-05 LAB — I-STAT CG4 LACTIC ACID, ED: Lactic Acid, Venous: 1.19 mmol/L (ref 0.5–2.2)

## 2014-02-05 LAB — D-DIMER, QUANTITATIVE (NOT AT ARMC): D DIMER QUANT: 0.75 ug{FEU}/mL — AB (ref 0.00–0.48)

## 2014-02-05 LAB — PHOSPHORUS: Phosphorus: 3.3 mg/dL (ref 2.3–4.6)

## 2014-02-05 MED ORDER — SODIUM CHLORIDE 0.9 % IJ SOLN
3.0000 mL | Freq: Two times a day (BID) | INTRAMUSCULAR | Status: DC
  Administered 2014-02-05 – 2014-02-07 (×3): 3 mL via INTRAVENOUS

## 2014-02-05 MED ORDER — ENOXAPARIN SODIUM 40 MG/0.4ML ~~LOC~~ SOLN
40.0000 mg | SUBCUTANEOUS | Status: DC
  Administered 2014-02-05: 40 mg via SUBCUTANEOUS
  Filled 2014-02-05 (×2): qty 0.4

## 2014-02-05 MED ORDER — SODIUM CHLORIDE 0.9 % IV BOLUS (SEPSIS)
1000.0000 mL | Freq: Once | INTRAVENOUS | Status: AC
  Administered 2014-02-05: 1000 mL via INTRAVENOUS

## 2014-02-05 MED ORDER — CETYLPYRIDINIUM CHLORIDE 0.05 % MT LIQD
7.0000 mL | Freq: Two times a day (BID) | OROMUCOSAL | Status: DC
  Administered 2014-02-06 – 2014-02-07 (×4): 7 mL via OROMUCOSAL

## 2014-02-05 MED ORDER — GUAIFENESIN-DM 100-10 MG/5ML PO SYRP
5.0000 mL | ORAL_SOLUTION | ORAL | Status: DC | PRN
  Filled 2014-02-05: qty 5

## 2014-02-05 MED ORDER — DEXTROSE 5 % IV SOLN
500.0000 mg | Freq: Once | INTRAVENOUS | Status: AC
  Administered 2014-02-05: 500 mg via INTRAVENOUS
  Filled 2014-02-05: qty 500

## 2014-02-05 MED ORDER — ACETAMINOPHEN 650 MG RE SUPP: 650.0000 mg | Freq: Four times a day (QID) | RECTAL | Status: DC | PRN

## 2014-02-05 MED ORDER — ACETAMINOPHEN 325 MG PO TABS
650.0000 mg | ORAL_TABLET | Freq: Four times a day (QID) | ORAL | Status: DC | PRN
  Filled 2014-02-05: qty 2

## 2014-02-05 MED ORDER — CETIRIZINE HCL 5 MG/5ML PO SYRP
5.0000 mg | ORAL_SOLUTION | Freq: Every day | ORAL | Status: DC
  Administered 2014-02-05 – 2014-02-07 (×3): 5 mg via ORAL
  Filled 2014-02-05 (×4): qty 5

## 2014-02-05 MED ORDER — DEXTROSE 5 % IV SOLN
1.0000 g | Freq: Once | INTRAVENOUS | Status: AC
  Administered 2014-02-05: 1 g via INTRAVENOUS
  Filled 2014-02-05: qty 10

## 2014-02-05 MED ORDER — RANITIDINE HCL 15 MG/ML PO SYRP
60.0000 mg | ORAL_SOLUTION | Freq: Every day | ORAL | Status: DC
  Administered 2014-02-05 – 2014-02-07 (×3): 60 mg via ORAL
  Filled 2014-02-05 (×5): qty 4

## 2014-02-05 MED ORDER — DEXTROSE 5 % IV SOLN
1.0000 g | INTRAVENOUS | Status: DC
  Administered 2014-02-06: 1 g via INTRAVENOUS
  Filled 2014-02-05: qty 10

## 2014-02-05 MED ORDER — DEXTROSE 5 % IV SOLN
500.0000 mg | INTRAVENOUS | Status: DC
  Administered 2014-02-06: 500 mg via INTRAVENOUS
  Filled 2014-02-05: qty 500

## 2014-02-05 MED ORDER — SODIUM CHLORIDE 0.9 % IV SOLN
INTRAVENOUS | Status: DC
  Administered 2014-02-05 – 2014-02-06 (×2): via INTRAVENOUS

## 2014-02-05 MED ORDER — PANTOPRAZOLE SODIUM 20 MG PO TBEC
20.0000 mg | DELAYED_RELEASE_TABLET | Freq: Every day | ORAL | Status: DC
  Filled 2014-02-05 (×2): qty 1

## 2014-02-05 MED ORDER — IOHEXOL 350 MG/ML SOLN
80.0000 mL | Freq: Once | INTRAVENOUS | Status: AC | PRN
  Administered 2014-02-05: 80 mL via INTRAVENOUS

## 2014-02-05 NOTE — ED Provider Notes (Signed)
CSN: [id number]     Arrival date & time 02/05/14  1021 History   First MD Initiated Contact with Patient 02/05/14 1126     Chief Complaint  Patient presents with  . Chest Pain  . Shortness of Breath  . Cough     (Consider location/radiation/quality/duration/timing/severity/associated sxs/prior Treatment) HPI Comments: Patient is a 23 year old male past medical history significant for Duchenne muscular dystrophy, GERD, seasonal allergies presenting to the emergency department for 2 days of chest pain with shortness of breath with mucous production. Patient states his chest pain is worsened with deep inspiration. Denies any alleviating factors. Patient states he has had similar pain in the past with associated pneumonias. Denies any fevers, chills, nausea, vomiting, abdominal pain.   Patient is a 23 y.o. male presenting with chest pain and shortness of breath.  Chest Pain Associated symptoms: shortness of breath   Shortness of Breath Associated symptoms: chest pain     Past Medical History  Diagnosis Date  . Muscular dystrophy   . Duchenne muscular dystrophy   . Allergy     Seasonal  . GERD (gastroesophageal reflux disease)    Past Surgical History  Procedure Laterality Date  . Back surgery    . Joint replacement    . Abdominal surgery    . Gastrostomy tube placement      Placed ~2009. Not used since 2011.   Family History  Problem Relation Age of Onset  . Diabetes Maternal Grandfather   . Hypertension Maternal Grandfather    History  Substance Use Topics  . Smoking status: Passive Smoke Exposure - Never Smoker  . Smokeless tobacco: Not on file  . Alcohol Use: No    Review of Systems  Respiratory: Positive for shortness of breath.   Cardiovascular: Positive for chest pain.  All other systems reviewed and are negative.     Allergies  Review of patient's allergies indicates no known allergies.  Home Medications   Prior to Admission medications   Medication  Sig Start Date End Date Taking? Authorizing Provider  cetirizine (ZYRTEC) 1 MG/ML syrup Take 5 mg by mouth at bedtime.   Yes Historical Provider, MD  ibuprofen (ADVIL,MOTRIN) 100 MG/5ML suspension Take 200 mg by mouth every 6 (six) hours as needed (headache).   Yes Historical Provider, MD  ranitidine (ZANTAC) 15 MG/ML syrup Take 60 mg by mouth at bedtime. 4 mls   Yes Historical Provider, MD   BP 117/92  Pulse 106  Temp(Src) 97.7 F (36.5 C) (Oral)  Resp 21  SpO2 99% Physical Exam  Nursing note and vitals reviewed. Constitutional: He is oriented to person, place, and time. No distress.  Thin Frail.   HENT:  Head: Normocephalic and atraumatic.  Right Ear: External ear normal.  Left Ear: External ear normal.  Nose: Nose normal.  Mouth/Throat: Oropharynx is clear and moist. No oropharyngeal exudate.  Eyes: Conjunctivae are normal.  Neck: Normal range of motion. Neck supple.  Cardiovascular: Regular rhythm and normal heart sounds.  Tachycardia present.   Pulmonary/Chest: Effort normal and breath sounds normal. No respiratory distress.  Abdominal: Soft.  Musculoskeletal: Normal range of motion.  Neurological: He is alert and oriented to person, place, and time.  Skin: Skin is warm and dry. He is not diaphoretic.  Psychiatric: He has a normal mood and affect.    ED Course  Procedures (including critical care time) Labs Review Labs Reviewed  D-DIMER, QUANTITATIVE - Abnormal; Notable for the following:    D-Dimer, Quant 0.75 (*)  All other components within normal limits  CBC WITH DIFFERENTIAL - Abnormal; Notable for the following:    WBC 3.5 (*)    All other components within normal limits  BASIC METABOLIC PANEL - Abnormal; Notable for the following:    Glucose, Bld 140 (*)    Creatinine, Ser <0.20 (*)    All other components within normal limits  TROPONIN I  I-STAT CG4 LACTIC ACID, ED    Imaging Review Dg Chest 1 View  02/05/2014   CLINICAL DATA:  Difficulty breathing   EXAM: CHEST - 1 VIEW  COMPARISON:  March 23, 2013  FINDINGS: There is no edema or consolidation. Heart size and pulmonary vascularity are normal. No adenopathy. There is rod fixation throughout the thoracic region.  IMPRESSION: No edema or consolidation.   Electronically Signed   By: Lowella Grip M.D.   On: 02/05/2014 11:09   Ct Angio Chest W/cm &/or Wo Cm  02/05/2014   CLINICAL DATA:  Chest congestion. Productive cough. Shortness of breath.  EXAM: CT ANGIOGRAPHY CHEST WITH CONTRAST  TECHNIQUE: Multidetector CT imaging of the chest was performed using the standard protocol during bolus administration of intravenous contrast. Multiplanar CT image reconstructions and MIPs were obtained to evaluate the vascular anatomy.  CONTRAST:  11mL OMNIPAQUE IOHEXOL 350 MG/ML SOLN  COMPARISON:  02/05/2014.  FINDINGS: Study is technically degraded by artifact from spinal fixation hardware. This produces multiple areas of artifactual filling defects in the pulmonary arteries. On the coronal and sagittal reconstructions, there is no convincing evidence of a pulmonary arterial filling defect to suggest an acute pulmonary embolus. No aggressive osseous lesions.  Pleural plaque is present along the low RIGHT lateral upper lobe, probably due to scarring. There is associated faint ground-glass attenuation which is favored to represent scarring rather than recurrent pneumonia. Central airways appear adequately patent. Pectus excavatum. Pectus deformity produces leftward deviation of the heart. Aorta and branch vessels appear within normal limits. There are no effusions. The upper abdomen is grossly within normal limits.  No gross aggressive osseous lesions. Thoracic fusion appears solid. Many of the thoracic fixation screws protrude through the anterior vertebral body.  Review of the MIP images confirms the above findings.  IMPRESSION: 1. Negative for pulmonary embolus or acute aortic abnormality. 2. Study is degraded by artifact  from thoracic fixation hardware. 3. Non calcified pleural plaque with adjacent ground-glass attenuation in the lateral RIGHT upper lobe, probably representing scarring. Recurrent pneumonia is considered less likely based on the configuration. 4. Severe pectus excavatum deformity.   Electronically Signed   By: Dereck Ligas M.D.   On: 02/05/2014 15:12     EKG Interpretation None      Patient declining any medications for pain.   MDM   Final diagnoses:  Other chest pain  Tachycardia    Filed Vitals:   02/05/14 1545  BP: 117/92  Pulse:   Temp:   Resp: 21   Afebrile, NAD, non-toxic appearing, AAOx4.   Patient presenting with 2 day history of CP, SOB, and mucus production similar to previous PNA. Patient noted to be tachycardic on examination with regular rhythm. Lungs CTA. I have reviewed nursing notes, vital signs, and all appropriate lab and imaging results for this patient. D-dimer elevated, CT angio chest ordered w/o evidence of PE, scarring vs. PNA noted on CTA. Given medical history, tachycardia, and similar symptoms to previous PNA will start treatment for CAP. Discussed patient with Family Medicine Service who will admit the patient. Patient d/w with Dr.  Ward, agrees with plan.        Harlow Mares, PA-C 02/05/14 1712

## 2014-02-05 NOTE — Progress Notes (Signed)
Pharmacy asked to start ceftriaxone and azithromycin for community acquired pneumonia.  Ceftriaxone 1g x 1 and azithromycin $RemoveBeforeDE'500mg'aBxUiFwrPdhCNMQ$  x 1 already ordered by ED provider.    Plan: Ceftriaxone 1g IV daily and Azithromycin $RemoveBeforeDE'500mg'EkpNjEpmMPVAVWA$  IV daily  Pharmacy will sign off.  Please reconsult if needed.  Heide Guile, PharmD, BCPS Clinical Pharmacist Pager [phone]

## 2014-02-05 NOTE — Progress Notes (Signed)
Jeremy Camacho [id number] Code Status: Full   Admission Data: 02/05/2014 7:12 PM Attending Provider:  Gwendlyn Deutscher  TOI:[id number], Harrell Gave, MD Consults/ Treatment Team:    [name] is a 23 y.o. male patient admitted from ED awake, alert - oriented  X 3 - no acute distress noted.  VSS - Blood pressure 110/73, pulse 117, temperature 98.2 F (36.8 C), temperature source Oral, resp. rate 22, height $RemoveBe'5\' 6"'eKOFDaSBD$  (1.676 m), weight 27.216 kg (60 lb), SpO2 100.00%.    IV in place, occlusive dsg intact without redness.  Orientation to room, and floor completed with information packet given to patient/family. Admission INP armband ID verified with patient/family, and in place.   SR up x 2, fall assessment complete, with patient and family able to verbalize understanding of risk associated with falls, and verbalized understanding to call nsg before up out of bed.  Call light within reach, patient able to voice, and demonstrate understanding.  Skin, clean-dry- intact without evidence of bruising, or skin tears.   No evidence of skin break down noted on exam.     Will cont to eval and treat per MD orders.  Henriette Combs, [place] 02/05/2014 7:12 PM

## 2014-02-05 NOTE — ED Notes (Signed)
Pt presents to department for evaluation of chest congestion, productive cough and SOB. Ongoing x2 days. CP increases with deep breathing. Pt is alert and oriented x4.

## 2014-02-05 NOTE — ED Provider Notes (Signed)
Medical screening examination/treatment/procedure(s) were performed by non-physician practitioner and as supervising physician I was immediately available for consultation/collaboration.   EKG Interpretation None        [place], DO 02/05/14 1801

## 2014-02-05 NOTE — ED Notes (Signed)
Phlebotomy unsuccessful at drawing labs.

## 2014-02-05 NOTE — H&P (Signed)
[facility] Admission History and Physical Service Pager: [phone]  Patient name: Jeremy Camacho Medical record number: [id number] Date of birth: 1990/09/23 Age: 23 y.o. Gender: male  Primary Care Provider: Christa See, MD Consultants: none Code Status: Full   Chief Complaint: Right sided chest pain and yellow sputum production for two days.   Assessment and Plan: Jeremy Camacho is a 23 y.o. male presenting with suspected early CAP. PMH is significant for Duchenne Muscular Dystrophy (followed by Dr. Vallarie Mare at [facility]), CAP (last in 08/2012), GERD, Seasonal Allergies, Right Great Toe Ulceration / Skin breakdown  #Suspected CAP vs. Viral Respiratory Infection - Pt. Presents with two days of yellow sputum production, and right sided chest discomfort in addition to decreased PO intake. ED evaluation revealed pt. Afebril, normotensive, but tachycardic to 120's with subsequent improvement with IVF. Laboratory evaluation revealed WBC 3.5. Given the patient's tachycardia and relative immobility, a D-dimer was drawn which was 0.75 and mildly elevated. CTA was performed which showed possible scarring of the lung parenchyma vs. Possible RLL pneumonia. He has overall appeared very clinically stable.  - Admit to floor under Dr. Gwendlyn Deutscher - CBC, CMP in am  - Vitals per floor - Fluid resuscitation; bolus 1L in ED.  - Chest physiotherapy  - incentive spirometry  - Robitussin for cough - Ceftriaxone 1g qd, Azithromycin $RemoveBeforeDE'500mg'biksFAGLuDPFtGL$  qd. - likely transition to po antibiotics tomorrow.  - CXR without evidence of pneumonia, CTA - no acute cardiopulmonary process. No PE - Zantac per home regimen   #Duchenne Muscular Dystrophy - Pt. Is very frail and thin at this time. He is followed by Dr. Vallarie Mare at [facility] where he was last seen in 08/2013. He has had an ongoing workup there including recommendation of Echocardiogram for further cardiac workup, however he has not received this  yet. Pt. Is able to swallow food on his own at this point, however he has a g-tube placed, though he has not used it for about 3 years. He says that he is at his baseline overall.  - Plan to continue follow up with Dr. Vallarie Mare in 1-2 months. - Currently asymptomatic.   #EKG with increased voltage: no murmur on exam but  - Echo in the am - Last Echo 2005 essentially normal / hyperdynamic with increased EF of 60-75%. - Troponins Q6hr.   #Right great toe ulcer: recurrent. Has been treated with antibiotics previously. His mother denies ever having it removed.  - Should be covered with antibiotics as above - consider outpatient nail removal.   #Severe Malnutrition - pt. Appearing overall emaciated. He endorses decreased po intake with overall decline in functional ability. He has a g-tube placed, but has not used it in 3-4 years.  - Cachectic appearing - CMP for LFT's, Albumin - Check Mg. Phos - TSH - Nutrition consult - PT / OT   FEN/GI: Full diet as pt. Takes solid po / protonix $RemoveBe'20mg'dMGPlVSFt$  po qd.  Prophylaxis: Lovenox  Disposition: Admit for observation  History of Present Illness: Jeremy Camacho is a 23 y.o. male presenting with sputum and cough for two days. He says that he felt some right upper lung discomfort as well. He endorses ongoing allergic rhinitis and year round allergies for which he takes zyrtec. He says that he has had some yellowish sputum that he has had a hard time coughing up. He also endorses mild sore throat. He denies nausea, vomiting, fever, chills, or diarrhea. He says that he has had  reduced po intake recently. He does eat on his own. He has a g-tube placed that he has not used in >3 years. He is followed by Dr. Alphonzo Dublin at [facility] for his DMD, and he was last seen in 4/ 2015. He denies active chest pain at this time. He does endorse mild SOB, but says that he is at his baseline regarding this secondarily impaired respiratory function. He has no other complaints.    Review  Of Systems: Per HPI with the following additions: None Otherwise 12 point review of systems was performed and was unremarkable.  Patient Active Problem List   Diagnosis Date Noted  . CAP (community acquired pneumonia) 03/24/2013  . Unspecified constipation 03/07/2013  . Skin ulcer of right great toe, limited to breakdown of skin 03/07/2013  . GERD (gastroesophageal reflux disease) 10/12/2012  . Seasonal allergies 10/12/2012  . Muscular dystrophy 09/14/2012   Past Medical History: Past Medical History  Diagnosis Date  . Muscular dystrophy   . Duchenne muscular dystrophy   . Allergy     Seasonal  . GERD (gastroesophageal reflux disease)    Past Surgical History: Past Surgical History  Procedure Laterality Date  . Back surgery    . Joint replacement    . Abdominal surgery    . Gastrostomy tube placement      Placed ~2009. Not used since 2011.   Social History: History  Substance Use Topics  . Smoking status: Passive Smoke Exposure - Never Smoker  . Smokeless tobacco: Not on file  . Alcohol Use: No   Additional social history: None Please also refer to relevant sections of EMR.  Family History: Family History  Problem Relation Age of Onset  . Diabetes Maternal Grandfather   . Hypertension Maternal Grandfather    Allergies and Medications: No Known Allergies No current facility-administered medications on file prior to encounter.   No current outpatient prescriptions on file prior to encounter.    Objective: BP 117/92  Pulse 106  Temp(Src) 97.7 F (36.5 C) (Oral)  Resp 21  SpO2 99% Exam: General: NAD, AAOx3, Emaciated HEENT: NCAT, Poor Dentition, PERRLA, EOMI Cardiovascular: Tachycardic, RR, Increased PMI, No MGR, Emaciated chest, with pectus excavatum Respiratory: CTA Bilaterally, no crackles, no wheezes, no rales audible. Poor respiratory effort, and impaired air movement 2/2 chronic muscle wasting, no Increased WOB at this time.  Abdomen: G-tube in place  C/D/I, No TTP, Soft, No organomegaly noted, Flat Extremities: Emaciated, Very little muscle left, 2+ distal pulses, Little ability to move at this point perhaps 1/5 motor strength due to muscle wasting. Scab over R Great toe noted, no purulence or TTP on exam.  Skin: WWP, no rashes, no ulcerations or breakdown noted Neuro: Grossly neurologically intact, overall impaired motor function 2/2 muscle wasting, No sensory impairment.   Labs and Imaging: CBC BMET   Recent Labs Lab 02/05/14 1219  WBC 3.5*  HGB 14.2  HCT 43.8  PLT 169    Recent Labs Lab 02/05/14 1219  NA 140  K 4.2  CL 99  CO2 28  BUN 10  CREATININE <0.20*  GLUCOSE 140*  CALCIUM 9.5     EKG 9/15: Enlarged R atria, LVH, RVH, Increased voltage likely 2/2 proximity of leads to the heart, Inverted T-waves V1-V3 without reciprocal changes, poor R-wave progression. In the setting of no chest pain.    CXR 9/15:  CLINICAL DATA: Difficulty breathing  EXAM:  CHEST - 1 VIEW  COMPARISON: March 23, 2013  FINDINGS:  There is  no edema or consolidation. Heart size and pulmonary  vascularity are normal. No adenopathy. There is rod fixation  throughout the thoracic region.  IMPRESSION:  No edema or consolidation.  Electronically Signed  By: Lowella Grip M.D.  On: 02/05/2014 11:09   CTA Chest 9/15:  CLINICAL DATA: Chest congestion. Productive cough. Shortness of  breath.  EXAM:  CT ANGIOGRAPHY CHEST WITH CONTRAST   IMPRESSION:  1. Negative for pulmonary embolus or acute aortic abnormality.  2. Study is degraded by artifact from thoracic fixation hardware.  3. Non calcified pleural plaque with adjacent ground-glass  attenuation in the lateral RIGHT upper lobe, probably representing  scarring. Recurrent pneumonia is considered less likely based on the  configuration.  4. Severe pectus excavatum deformity.    Rosemarie Ax, MD 02/05/2014, 4:11 PM PGY-1, [place] Intern pager:  [phone], text pages welcome   Upper Level Addendum:  I have seen and evaluated this patient along with Dr. Minda Ditto and reviewed the above note, making necessary revisions in [facility].   Clearance Coots, MD Family Medicine PGY-2

## 2014-02-05 NOTE — Progress Notes (Signed)
Attempted to call report x 1  

## 2014-02-05 NOTE — Progress Notes (Signed)
Pt does not feel like he is able to cough up any sputum at this time. Cup was left at bedside with mother. RT instructed pt that if he is able to produce any to spit in cup and notify staff.

## 2014-02-05 NOTE — Progress Notes (Addendum)
Family Medicine PGY-3 PCP Note  Pt seen at bedside. He and his mother are in fairly good spirits. He is feeling some better since he was admitted but "still feels pretty bad." He is not in any respiratory distress and sats are maintained on room air. He denies SOB but does have some nausea worse since he has been getting azithromycin.  I appreciate the excellent care provided by the inpatient FPTS and all consultants as well as all nursing and other ancillary staff on behalf of my patient. Please do not hesitate to contact me if I can be of any help. Of note, pt has an appointment scheduled with me for next week and I will plan to address his mother's request for a home aide at that time (it will require a PCP office visit, anyway).  Emmaline Kluver, MD PGY-3, [place] [facility] 02/06/2014, 5:24 PM First call: [place] pager: [phone] (text pages welcome through [facility]) Personal pager: [phone]

## 2014-02-05 NOTE — ED Notes (Signed)
Admitting MD at bedside.

## 2014-02-05 NOTE — Progress Notes (Signed)
Patient states that he has used a flutter valve before and will use it 10 times an hour every hour he feels like it or thinks he needs it. Patient is aware to call if he needs any help with the flutter. RT will continue to assist as needed.

## 2014-02-06 DIAGNOSIS — R9431 Abnormal electrocardiogram [ECG] [EKG]: Secondary | ICD-10-CM

## 2014-02-06 DIAGNOSIS — L97509 Non-pressure chronic ulcer of other part of unspecified foot with unspecified severity: Secondary | ICD-10-CM

## 2014-02-06 DIAGNOSIS — E43 Unspecified severe protein-calorie malnutrition: Secondary | ICD-10-CM | POA: Insufficient documentation

## 2014-02-06 LAB — CBC
HEMATOCRIT: 38.4 % — AB (ref 39.0–52.0)
HEMOGLOBIN: 12.2 g/dL — AB (ref 13.0–17.0)
MCH: 29.2 pg (ref 26.0–34.0)
MCHC: 31.8 g/dL (ref 30.0–36.0)
MCV: 91.9 fL (ref 78.0–100.0)
Platelets: 163 10*3/uL (ref 150–400)
RBC: 4.18 MIL/uL — ABNORMAL LOW (ref 4.22–5.81)
RDW: 12.5 % (ref 11.5–15.5)
WBC: 3.8 10*3/uL — ABNORMAL LOW (ref 4.0–10.5)

## 2014-02-06 LAB — COMPREHENSIVE METABOLIC PANEL
ALT: 17 U/L (ref 0–53)
ANION GAP: 8 (ref 5–15)
AST: 30 U/L (ref 0–37)
Albumin: 3.4 g/dL — ABNORMAL LOW (ref 3.5–5.2)
Alkaline Phosphatase: 66 U/L (ref 39–117)
BILIRUBIN TOTAL: 0.3 mg/dL (ref 0.3–1.2)
BUN: 6 mg/dL (ref 6–23)
CO2: 31 mEq/L (ref 19–32)
Calcium: 8.3 mg/dL — ABNORMAL LOW (ref 8.4–10.5)
Chloride: 102 mEq/L (ref 96–112)
Creatinine, Ser: <0.2 mg/dL — ABNORMAL LOW (ref 0.50–1.35)
GLUCOSE: 127 mg/dL — AB (ref 70–99)
Potassium: 3.4 mEq/L — ABNORMAL LOW (ref 3.7–5.3)
Sodium: 141 mEq/L (ref 137–147)
TOTAL PROTEIN: 6.5 g/dL (ref 6.0–8.3)

## 2014-02-06 MED ORDER — ACETAMINOPHEN 160 MG/5ML PO SOLN
650.0000 mg | Freq: Four times a day (QID) | ORAL | Status: DC | PRN
  Administered 2014-02-06 – 2014-02-07 (×2): 650 mg via ORAL
  Filled 2014-02-06 (×2): qty 20.3

## 2014-02-06 MED ORDER — ENOXAPARIN SODIUM 30 MG/0.3ML ~~LOC~~ SOLN
30.0000 mg | SUBCUTANEOUS | Status: DC
  Administered 2014-02-06: 30 mg via SUBCUTANEOUS
  Filled 2014-02-06 (×3): qty 0.3

## 2014-02-06 MED ORDER — PROMETHAZINE HCL 6.25 MG/5ML PO SYRP
12.5000 mg | ORAL_SOLUTION | Freq: Four times a day (QID) | ORAL | Status: DC | PRN
  Filled 2014-02-06: qty 10

## 2014-02-06 MED ORDER — ACETAMINOPHEN 160 MG/5ML PO SOLN
650.0000 mg | Freq: Four times a day (QID) | ORAL | Status: DC | PRN
  Administered 2014-02-06: 650 mg via ORAL
  Filled 2014-02-06: qty 20.3

## 2014-02-06 MED ORDER — LEVOFLOXACIN 25 MG/ML PO SOLN
750.0000 mg | Freq: Every day | ORAL | Status: DC
Start: 2014-02-07 — End: 2014-02-08
  Administered 2014-02-07 – 2014-02-08 (×2): 750 mg via ORAL
  Filled 2014-02-06 (×2): qty 30

## 2014-02-06 MED ORDER — ACETAMINOPHEN 650 MG RE SUPP: 650.0000 mg | Freq: Four times a day (QID) | RECTAL | Status: DC | PRN

## 2014-02-06 MED ORDER — POTASSIUM CHLORIDE 10 MEQ/100ML IV SOLN
10.0000 meq | INTRAVENOUS | Status: AC
  Administered 2014-02-06 (×3): 10 meq via INTRAVENOUS
  Filled 2014-02-06 (×3): qty 100

## 2014-02-06 MED ORDER — SODIUM CHLORIDE 0.9 % IV BOLUS (SEPSIS)
250.0000 mL | Freq: Once | INTRAVENOUS | Status: AC
  Administered 2014-02-06: 250 mL via INTRAVENOUS

## 2014-02-06 MED ORDER — PANTOPRAZOLE SODIUM 40 MG PO PACK
20.0000 mg | PACK | Freq: Every day | ORAL | Status: DC
  Administered 2014-02-06 – 2014-02-08 (×3): 20 mg via ORAL
  Filled 2014-02-06 (×3): qty 20

## 2014-02-06 NOTE — Progress Notes (Signed)
Patient was asleep and didn't want to be woke up to perform flutter valve. RT will continue to monitor.

## 2014-02-06 NOTE — Evaluation (Signed)
Physical Therapy Evaluation Patient Details Name: NOLTON DENIS MRN: [id number] DOB: 05-11-1991 Today's Date: 02/06/2014   History of Present Illness  Patient is a 23 y/o male admitted for right sided chest pain and yellow sputum production for two days. Pt presenting with suspected CAP vs viral respiratory infection. PMH is significant for Duchenne Muscular Dystrophy (followed by Dr. Vallarie Mare at [facility]), CAP (last in 08/2012), GERD, Seasonal Allergies and Right Great Toe Ulceration / Skin breakdown.    Clinical Impression  Patient presents with functional limitations due to deficits listed in PT problem list (see below). Pt requires total A for all care and transfers. Has motorized w/c at home for mobility however requires some assist with the controls. Pt's mother requesting some help at home, especially during the day when she works - in the process of getting an Engineer, production. Recommend HHPT for education to prevent contractures, repositioning and to help maintain function/prevent further weakness/deconditioning as mother reports pt is having increased difficulty with right hand and working the remote control/wheel chair control at home more recently. Mother aware Duchenne's is a progressive condition. Pt is functioning at baseline from a mobility standpoint so does not require skilled therapy services in the acute setting but would benefit from follow up HHPT due to reasons above.    Follow Up Recommendations Home health PT;Supervision/Assistance - 24 hour    Equipment Recommendations  None recommended by PT    Recommendations for Other Services       Precautions / Restrictions Precautions Precautions: None Precaution Comments: Contractures present BUEs/LEs. Restrictions Weight Bearing Restrictions: No      Mobility  Bed Mobility Overal bed mobility: Needs Assistance             General bed mobility comments: No bed mobility performed today as pt's mother demonstrating how patient  transfers to chair. Total A.  Transfers Overall transfer level: Needs assistance Equipment used: None             General transfer comment: Total A as pt's mother picks up patient from bed and places him in w/c. Reports she has been doing this for years.  Transfer performed on RA, Sa02 ranged from 91%-95%. Education provided that dizziness can be a symptom of lack of 02. Pt verbalized understanding.  Ambulation/Gait         Gait velocity: Pt non ambulatory.      Stairs            Information systems manager mobility: Yes Wheelchair propulsion: Right upper extremity Wheelchair parts: Needs Solicitor Details (indicate cue type and reason): Able to elevate and tilt motorized chair and move forward with stick however requires assist changing modes from mobility to change in position (which is pressing a button). Pt declined any further mobility in chair due to increase in HA.  Modified Rankin (Stroke Patients Only)       Balance Overall balance assessment: Needs assistance   Sitting balance-Leahy Scale: Zero Sitting balance - Comments: Pt not able to sit up without total A- baseline.       Standing balance comment: NA                             Pertinent Vitals/Pain Pain Assessment: 0-10 Pain Score:  (not rated on pain scale.) Pain Location: Headache- worsened with movement. Pain Descriptors / Indicators: Aching;Sore;Headache Pain Intervention(s): Monitored during session;Repositioned;Patient requesting pain meds-RN notified    Home  Living Family/patient expects to be discharged to:: Private residence Living Arrangements: Parent;Other relatives Available Help at Discharge: Family;Available PRN/intermittently (Mother works during the day part time from 1-6 pm.) Type of Home: House Home Access: Ramped entrance     Home Layout: One level Home Equipment: Electric scooter;Hospital bed;Bedside  commode      Prior Function Level of Independence: Needs assistance   Gait / Transfers Assistance Needed: Pt is non ambulatory. Mothers picks patient up to transfer to/from motorized w/c and to/from [facility]. Pt uses motorized w/c, requires some assist pushing buttons to adjust the mode of the chair. Reports playing xbox daily.  ADL's / Homemaking Assistance Needed: Total A for ADLs. Pt's mother reports pt gets bathed once/week due to her not having time. Pt needs to be fed. Pt's mother reports she feeds him in morning and he does not usually eat until she gets home- reports he is losing weight.  Comments: Mother reports she is in the process of getting an aide to assist during the day when she is at work. Has an appt to sign paperwork next Wednesday.     Hand Dominance        Extremity/Trunk Assessment   Upper Extremity Assessment: Defer to OT evaluation;RUE deficits/detail RUE Deficits / Details: Wasted musculature and decrease tone present in hands and throughout BUEs. Able to minimally use RUE to operate remote control of chair. Elbow flexion contractures present BUEs - difficulty and pain with PROM.         Lower Extremity Assessment: RLE deficits/detail;LLE deficits/detail RLE Deficits / Details: Decreased muscle tone throughout all musculature in RLE. Contractures present in hip flexors and knee flexors- able to passively extend RLE but lacking full extension. LLE Deficits / Details: Decreased muscle tone throughout all musculature in LLE. Contractures present in hip flexors and knee flexors- able to passively extend RLE but lacking full extension.     Communication   Communication: No difficulties  Cognition Arousal/Alertness: Awake/alert Behavior During Therapy: WFL for tasks assessed/performed Overall Cognitive Status: Within Functional Limits for tasks assessed (A&O x4.)                      General Comments General comments (skin integrity, edema, etc.): R great  toe sore noted.     Exercises        Assessment/Plan    PT Assessment All further PT needs can be met in the next venue of care  PT Diagnosis Generalized weakness;Acute pain   PT Problem List Decreased strength;Pain;Cardiopulmonary status limiting activity;Decreased range of motion;Impaired tone;Decreased balance;Decreased mobility  PT Treatment Interventions     PT Goals (Current goals can be found in the Care Plan section) Acute Rehab PT Goals PT Goal Formulation: No goals set, d/c therapy Time For Goal Achievement: 02/20/14 Potential to Achieve Goals: Good    Frequency     Barriers to discharge        Co-evaluation               End of Session   Activity Tolerance: Patient limited by pain (headache) Patient left: in chair;with family/visitor present Nurse Communication: Mobility status;Need for lift equipment         Time: [phone] PT Time Calculation (min): 35 min   Charges:   PT Evaluation $Initial PT Evaluation Tier I: 1 Procedure PT Treatments $Self Care/Home Management: 8-22   PT G Codes:          Folan, Keyira Mondesir A 02/06/2014, 9:41 AM Blake Divine  Neal Dy, [place], [place]

## 2014-02-06 NOTE — H&P (Signed)
FMTS Attending Admit Note Patient seen and examined by me, discussed with resident team and I agree with admitting resident note as documented. Patient reports this morning a resolution in the chest pain, marked improvement in shortness of breath. Other labs and studies as noted.  Plan to transition his abx treatment for CAP and continue to monitor while on oral treatment. Wean oxygen supplementation. He does not have an elevated WBC count (3.8 this morning). Close monitoring. Dalbert Mayotte, MD

## 2014-02-06 NOTE — Progress Notes (Signed)
INITIAL NUTRITION ASSESSMENT  DOCUMENTATION CODES Per approved criteria  -Severe  malnutrition in the context of social or environmental circumstances -Extremely Underweight  Pt meets criteria for SEVERE MALNUTRITION in the context of SOCIAL/ENVIRONMENTAL circumstances as evidenced by severe muscle wasting, severe fat wasting, and estimated energy intake <50% of estimated energy needs for > 1 month (and 48% weight loss in <18 months).  INTERVENTION: Provide Mighty Shakes TID in between meals and PRN with meals Magic cup ice cream PRN as desired by pt Encourage PO intake Provided and discussed "High Calorie High Protein Nutrition Therapy", "Tips for Adding Calories and Protein", and "High Calorie, High protein Recipes" handouts from the Academy of Nutrition and Dietetics Recommend social work consult  NUTRITION DIAGNOSIS: Malnutrition related to limited access to food as evidenced by 48% weight loss in less than 18 months and severe muscle wasting per physical exam.   Goal: Pt to meet >/= 90% of their estimated nutrition needs   Monitor:  PO intake, weight trend, labs  Reason for Assessment: Consult due to Protein Calorie Malnutrition  23 y.o. male  Admitting Dx: CAP  ASSESSMENT: 23 y.o. male presenting with suspected early CAP. PMH is significant for Duchenne Muscular Dystrophy (followed by Dr. Alphonzo Dublin at [facility]), CAP (last in 08/2012), GERD, Seasonal Allergies, Right Great Toe Ulceration / Skin breakdown Per chart pt has a G-tube but, it has not been used for the past 3 years.   Pt is severely underweight. Weight history show that pt has lost 48% of his body weight within the past 18 months and 24% of his body weight within the past 14 months.  Pt reports eating cereal and some eggs for breakfast this morning; he states this is a little less than he usually eats. Pt reports that 115 lbs is a good weight for him and is the highest he has ever weighed. He reports having an fairly  good appetite PTA but, he eats small amounts and only eats 2 meals and one snack daily; he drinks mostly water. Pt's mother reports that she prepares pt breakfast before she leaves for work but, pt doesn't typically get another meal until she gets home from work after 6 PM. Sometimes pt's sibling will give him a snack in the middle of the day. Mom states she is trying to find a CNA or home health nurse who can help provide care for pt during the day. Estimate that pt was consuming 600 to 800 kcal daily PTA.  RD encouraged pt to eat every 2-3 hours throughout the day. Discussed tips for adding calories and protein to foods to promote weight gain. Encouraged intake of calorie-dense beverages such as protein shakes, milk shakes, carnation instant breakfast, and juice. RD provided handouts.  Labs: low potassium, low calcium, low hemoglobin  Nutrition Focused Physical Exam:  Subcutaneous Fat:  Orbital Region: WNL Upper Arm Region: severe wasting Thoracic and Lumbar Region: NA  Muscle:  Temple Region: WNL Clavicle Bone Region: severe wasting Clavicle and Acromion Bone Region: severe wasting Scapular Bone Region: NA Dorsal Hand: severe wasting Patellar Region: severe wasting Anterior Thigh Region: severe wasting Posterior Calf Region: severe wasting  Edema: none   Height: Ht Readings from Last 1 Encounters:  02/05/14 5\' 6"  (1.676 m)    Weight: Wt Readings from Last 1 Encounters:  02/05/14 60 lb (27.216 kg)    Ideal Body Weight: 142 lbs  % Ideal Body Weight: 42%  Wt Readings from Last 10 Encounters:  02/05/14 60 lb (27.216  kg)  12/21/12 79 lb (35.834 kg)  09/14/12 115 lb (52.164 kg)    Usual Body Weight: 115 lbs  % Usual Body Weight: 52%  BMI:  Body mass index is 9.69 kg/(m^2). (Extremely Underweight)  Estimated Nutritional Needs: Kcal: 1200-1400 Protein: 45-55 grams Fluid: 1.4 L/day  Skin: intact  Diet Order: General  EDUCATION NEEDS: -No education needs  identified at this time   Intake/Output Summary (Last 24 hours) at 02/06/14 1211 Last data filed at 02/06/14 0700  Gross per 24 hour  Intake 2271.67 ml  Output      0 ml  Net 2271.67 ml    Last BM: 9/13   Labs:   Recent Labs Lab 02/05/14 1219 02/05/14 1828 02/06/14 0547  NA 140  --  141  K 4.2  --  3.4*  CL 99  --  102  CO2 28  --  31  BUN 10  --  6  CREATININE <0.20*  --  <0.20*  CALCIUM 9.5  --  8.3*  MG  --  1.7  --   PHOS  --  3.3  --   GLUCOSE 140*  --  127*    CBG (last 3)  No results found for this basename: GLUCAP,  in the last 72 hours  Scheduled Meds: . antiseptic oral rinse  7 mL Mouth Rinse BID  . azithromycin  500 mg Intravenous Q24H  . cefTRIAXone (ROCEPHIN)  IV  1 g Intravenous Q24H  . cetirizine HCl  5 mg Oral QHS  . enoxaparin (LOVENOX) injection  30 mg Subcutaneous Q24H  . pantoprazole sodium  20 mg Oral Daily  . ranitidine  60 mg Oral QHS  . sodium chloride  3 mL Intravenous Q12H    Continuous Infusions: . sodium chloride 100 mL/hr at 02/05/14 1830    Past Medical History  Diagnosis Date  . GERD (gastroesophageal reflux disease)   . Heart murmur   . Pneumonia     "several times"  . Duchenne muscular dystrophy   . Seasonal allergies     Past Surgical History  Procedure Laterality Date  . Abdominal surgery      "infection"  . Gastrostomy tube placement      Placed ~2009. Not used since 2011.  Marland Kitchen Spinal fusion    . Hip fracture surgery Left   . Back surgery      Pryor Ochoa RD, LDN Inpatient Clinical Dietitian Pager: [phone] After Hours Pager: [phone]

## 2014-02-06 NOTE — Progress Notes (Signed)
Chaplain responded to referral from MD that patient requested prayer.  Pt was sitting in chair at bedside and appeared accepting and appreciative of [place] visit.  Pt's associate pastor and mother were all present with patient in room.  Pt and Chaplain spent time in conversation while mother and associate pastor conversed.  Pt requested we all pray together.  Chaplain offered prayer, emotional and spiritual support as well as the ministry of presence.  Chaplain will continue to follow up with patient and family as needed.   02/06/14 1000  Clinical Encounter Type  Visited With Patient and family together;Other (Comment) (Pt's associate pastor)  Visit Type Initial;Spiritual support  Referral From Physician  Spiritual Encounters  Spiritual Needs Sacred text;Prayer;Emotional  Stress Factors  Patient Stress Factors Exhausted;Health changes  Family Stress Factors Exhausted;Health changes;Major life changes  Advance Directives (For Healthcare)  Does patient have an advance directive? No   Mertie Moores, Chaplain

## 2014-02-06 NOTE — Progress Notes (Signed)
Family Medicine Teaching Service Daily Progress Note Intern Pager: [phone]  Patient name: Jeremy Camacho Medical record number: [id number] Date of birth: [birth year] Age: 23 y.o. Gender: male  Primary Care Provider: Christa See, MD Consultants: None Code Status: Full  Pt Overview and Major Events to Date:  02/06/14 - Pt. To receive echocardiogram. Transition to PO antibiotics.   Assessment and Plan:  Jeremy Camacho is a 23 y.o. male presenting with suspected early CAP. PMH is significant for Duchenne Muscular Dystrophy (followed by Dr. Vallarie Mare at [facility]), CAP (last in 08/2012), GERD, Seasonal Allergies, Right Great Toe Ulceration / Skin breakdown   #Suspected CAP vs. Viral Respiratory Infection - Pt. Presents with two days of yellow sputum production, and right sided chest discomfort in addition to decreased PO intake. ED evaluation revealed pt. Afebril, normotensive, but tachycardic to 120's with subsequent improvement with IVF. Laboratory evaluation revealed WBC 3.5. Given the patient's tachycardia and relative immobility, a D-dimer was drawn which was 0.75 and mildly elevated. CTA was performed which showed possible scarring of the lung parenchyma vs. Possible RLL pneumonia. He has overall appeared very clinically stable.  - Admit to floor under Dr. Gwendlyn Deutscher  - CBC, CMP in am  - Vitals per floor  - Fluid resuscitation; bolus 1L in ED.  - Chest physiotherapy  - incentive spirometry  - Robitussin for cough  - Ceftriaxone 1g qd, Azithromycin $RemoveBeforeDE'500mg'lKspnjcetKSrJZR$  qd. - Transitioned to PO antibiotics today.  - CXR without evidence of pneumonia, CTA - no acute cardiopulmonary process. No PE  - Zantac per home regimen   #Duchenne Muscular Dystrophy - Pt. Is very frail and thin at this time. He is followed by Dr. Vallarie Mare at [facility] where he was last seen in 08/2013. He has had an ongoing workup there including recommendation of Echocardiogram for further cardiac workup, however he has not received this  yet. Pt. Is able to swallow food on his own at this point, however he has a g-tube placed, though he has not used it for about 3 years. He says that he is at his baseline overall.  - Plan to continue follow up with Dr. Vallarie Mare in 1-2 months.  - Currently asymptomatic.   #EKG with increased voltage: no murmur on exam but  - Echo pending - Last Echo 2005 essentially normal / hyperdynamic with increased EF of 60-75%.  - Troponins Q6hr negative x 3  #Right great toe ulcer: recurrent. Has been treated with antibiotics previously. His mother denies ever having it removed.  - Should be covered with antibiotics as above  - consider outpatient nail removal.   #Severe Malnutrition - pt. Appearing overall emaciated. He endorses decreased po intake with overall decline in functional ability. He has a g-tube placed, but has not used it in 3-4 years.  - Cachectic appearing , 48% weight loss in <18 months per nutrition.  - CMP for LFT's, Albumin  - Mg / Phos wnl.  - TSH normal - Nutrition consult recs pending - PT / OT to evaluate  FEN/GI: Full diet as pt. Takes solid po / protonix $RemoveBe'20mg'lBJIVXdzc$  po qd.  Prophylaxis: Lovenox   Disposition: Home once echo is performed.   Subjective:  No complaints this am. He says that he is not SOB, he denies coughing, fever, chills. He says that he slept well overnight.   Objective: Temp:  [98.2 F (36.8 C)-98.4 F (36.9 C)] 98.2 F (36.8 C) (09/16 1400) Pulse Rate:  [100-117] 100 (09/16 1400) Resp:  [18-22]  22 (09/16 1400) BP: (97-119)/(67-87) 108/78 mmHg (09/16 1400) SpO2:  [98 %-100 %] 99 % (09/16 1400) Weight:  [60 lb (27.216 kg)] 60 lb (27.216 kg) (09/15 1906) Physical Exam: General: NAD, AAOx3 Cardiovascular: RRR, No MGR, S1 / S2 normal Respiratory: CTA Bilaterally, No crackles, No rales, No TTP, Appropriate rate, No increased WOB Abdomen: Soft, Nontender, G-Tube in place, Nondistended, No organomegally Extremities: WWP, 2+ distal pulses bilaterally,  Emaciated, Very little muscle left, Little ability to move at this point perhaps 1/5 motor strength due to muscle wasting. Scab over R Great toe noted, no purulence or TTP on exam. Stable.    Laboratory:  Recent Labs Lab 02/05/14 1219 02/06/14 0547  WBC 3.5* 3.8*  HGB 14.2 12.2*  HCT 43.8 38.4*  PLT 169 163    Recent Labs Lab 02/05/14 1219 02/06/14 0547  NA 140 141  K 4.2 3.4*  CL 99 102  CO2 28 31  BUN 10 6  CREATININE <0.20* <0.20*  CALCIUM 9.5 8.3*  PROT  --  6.5  BILITOT  --  0.3  ALKPHOS  --  66  ALT  --  17  AST  --  30  GLUCOSE 140* 127*   EKG 9/15: Enlarged R atria, LVH, RVH, Increased voltage likely 2/2 proximity of leads to the heart, Inverted T-waves V1-V3 without reciprocal changes, poor R-wave progression. In the setting of no chest pain.   CXR 9/15:  CLINICAL DATA: Difficulty breathing  EXAM:  CHEST - 1 VIEW  COMPARISON: March 23, 2013  FINDINGS:  There is no edema or consolidation. Heart size and pulmonary  vascularity are normal. No adenopathy. There is rod fixation  throughout the thoracic region.  IMPRESSION:  No edema or consolidation.  Electronically Signed  By: Lowella Grip M.D.  On: 02/05/2014 11:09   Jeremy Hacker, MD 02/06/2014, 4:44 PM PGY-1, [place] Intern pager: [phone], text pages welcome

## 2014-02-06 NOTE — Progress Notes (Signed)
Orthopedic Tech Progress Note Patient Details:  Jeremy Camacho [birth year] [id number]  Ortho Devices Type of Ortho Device: Velcro wrist splint Ortho Device/Splint Location: rue Ortho Device/Splint Interventions: Application   Hildred Priest 02/06/2014, 8:15 PM

## 2014-02-06 NOTE — Progress Notes (Signed)
FMTS ATTENDING  NOTE Jeremy Cotham,MD I  have seen and examined this patient, reviewed their chart. I have discussed this patient with the resident. I agree with the resident's findings, assessment and care plan. 

## 2014-02-06 NOTE — Evaluation (Signed)
Occupational Therapy Evaluation Patient Details Name: Jeremy Camacho MRN: [id number] DOB: December 12, 1990 Today's Date: 02/06/2014    History of Present Illness Patient is a 23 y/o male admitted for right sided chest pain and yellow sputum production for two days. Pt presenting with suspected CAP vs viral respiratory infection. PMH is significant for Duchenne Muscular Dystrophy (followed by Dr. Vallarie Mare at [facility]), CAP (last in 08/2012), GERD, Seasonal Allergies and Right Great Toe Ulceration / Skin breakdown.    Clinical Impression   Patient admitted with above. Patient total assist PTA. Patient currently functioning at a total assist level. Feel patient will benefit from acute OT for splinting > RUE(wrist). Patient has functionally been using RUE to maneuver power chair. Patient uses BUEs for gaming and quality of life (fun) purposes. Believe patient will benefit from acute OT for education regarding wrist support splint that was administered today. Support splint encouraged to prevent worseining contracture so patient is able to functionally use RUE for functional mobility while in power chair.  LTG set for mother to be able to don/doff splint and manage splint including skin integrity. Recommended patient leave splint donned for ~2 hours today, taking it off for mother to inspect skin. Will follow up with doctor and put in an order for pre-fab wrist cock up splint. Acute OT to follow up with mother and patient regarding splinting and skin integrity check.     Follow Up Recommendations  No OT follow up    Equipment Recommendations  None recommended by OT, patient has a BSC that he uses for toileting needs.    Recommendations for Other Services  Recommend aid to assist with tasks at home. According to PT note, patient's mother works 1-6.      Precautions / Restrictions Precautions Precautions: None Precaution Comments: Contractures present BUEs/LEs. Required Braces or Orthoses:  (none  required) Restrictions Weight Bearing Restrictions: No      Mobility Bed Mobility  defer to PT evaluation   Transfers  Patient total assist with transfers, patient's mother picks patient up to perform transfer.    Balance  defer to PT evaluation     ADL Overall ADL's : At baseline;Needs assistance/impaired Patient overall total assist for BADLs at this time.      Vision  No change from baseline          Pertinent Vitals/Pain Pain Assessment: No/denies pain     Hand Dominance Right   Extremity/Trunk Assessment Upper Extremity Assessment Upper Extremity Assessment: RUE deficits/detail;LUE deficits/detail RUE Deficits / Details: Wasted musculature and decrease tone present in hands. Able to minimally use RUE to operate remote control of chair. LUE Deficits / Details: LUE worse than RUE, patient with wrist contracture that limits functional use. Patient's mother assistance with functional tasks. Patient is able to play video games with BUEs/hands   Lower Extremity Assessment Lower Extremity Assessment: Defer to PT evaluation       Communication Communication Communication: No difficulties   Cognition Arousal/Alertness: Awake/alert Behavior During Therapy: WFL for tasks assessed/performed Overall Cognitive Status: Within Functional Limits for tasks assessed             Home Living Family/patient expects to be discharged to:: Private residence Living Arrangements: Parent;Other relatives Available Help at Discharge: Family;Available PRN/intermittently Type of Home: House Home Access: Ramped entrance     Home Layout: One level     Bathroom Shower/Tub: Tub/shower unit (patient/family report bed baths)   Bathroom Toilet: Standard Bathroom Accessibility: Yes How Accessible: Accessible via wheelchair  Home Equipment: Electric scooter;Hospital bed;Bedside commode          Prior Functioning/Environment Level of Independence: Needs assistance  Gait /  Transfers Assistance Needed: Pt is non ambulatory. Mothers picks patient up to transfer to/from motorized w/c and to/from [facility] Pt uses motorized w/c, requires some assist pushing buttons to adjust the mode of the chair. Reports playing xbox daily. ADL's / Homemaking Assistance Needed: Total A for ADLs. Pt's mother reports pt gets bathed once/week due to her not having time. Pt needs to be fed. Pt's mother reports she feeds him in morning and he does not usually eat until she gets home- reports he is losing weight.   Comments: Mother reports she is in the process of getting an aide to assist during the day when she is at work. Has an appt to sign paperwork next Wednesday.    OT Diagnosis: Generalized weakness   OT Problem List: Other (comment) (knowledge of wrist support spling > RUE(skin integrity))   OT Treatment/Interventions: Splinting    OT Goals(Current goals can be found in the care plan section) Acute Rehab OT Goals Patient Stated Goal: none stated OT Goal Formulation: With patient/family Time For Goal Achievement: 02/13/14 Potential to Achieve Goals: Good ADL Goals Additional ADL Goal #1: Patient and mother will be independent with donning/doffing of wrist support splint and management of wrist support splint, including management of skin integrity.   OT Frequency: Min 1X/week   Barriers to D/C:  (none known at this time, mother reports need for aid)          End of Session    Activity Tolerance: Patient tolerated treatment well Patient left: in bed;with family/visitor present;with call bell/phone within reach   Time: 1420-1440 OT Time Calculation (min): 20 min Charges:  OT General Charges $OT Visit: 1 Procedure OT Evaluation $Initial OT Evaluation Tier I: 1 Procedure OT Treatments $Therapeutic Activity: 8-22 mins G-Codes:    Jalaiyah Throgmorton February 14, 2014, 2:56 PM

## 2014-02-06 NOTE — Progress Notes (Signed)
t was sleeping at time of CPT due. Family wanted pt to sleep

## 2014-02-06 NOTE — Progress Notes (Signed)
Utilization review completed. Surena Welge, RN, BSN. 

## 2014-02-07 ENCOUNTER — Inpatient Hospital Stay (HOSPITAL_COMMUNITY): Payer: Medicaid Other

## 2014-02-07 MED ORDER — POTASSIUM CHLORIDE 10 MEQ/100ML IV SOLN
10.0000 meq | INTRAVENOUS | Status: AC
  Administered 2014-02-07 (×3): 10 meq via INTRAVENOUS
  Filled 2014-02-07 (×3): qty 100

## 2014-02-07 MED ORDER — GUAIFENESIN-DM 100-10 MG/5ML PO SYRP: 5.0000 mL | ORAL_SOLUTION | ORAL | Status: AC | PRN

## 2014-02-07 MED ORDER — LEVOFLOXACIN 25 MG/ML PO SOLN: 750.0000 mg | Freq: Every day | ORAL | Status: AC

## 2014-02-07 NOTE — Progress Notes (Signed)
FMTS ATTENDING  NOTE Kilee Hedding,MD I  have seen and examined this patient, reviewed their chart. I have discussed this patient with the resident. I agree with the resident's findings, assessment and care plan. Patient had episode of desaturation over the night for which he was placed on venti mask. He feels ok this morning, he mentioned he has worsened SOB with lying flat on his back but this improves a lot by lying on his side. Respiratory exam was normal as well as cardiac exam. I reviewed his chest xray which was essentially normal. We will continue O2 supplement for now. F/U with nutritionist assessment for malnutrition.

## 2014-02-07 NOTE — Progress Notes (Signed)
Spoke with pt and his mother earlier this pm.  Mom is in the process of getting pt on the CAPS waiting list to assist her with taking care of pt.  Mom reports that pt has not had services through [place] since he was 23 and transitioned to Adult Medicaid.  Mom reports that she has received assistance with transportation for pt to get to medical appointments through the [employer] and also has a Lucianne Lei that she is able to transport in.  Pt has a younger brother and sister who are able to assist mom in caring for him.  Pt has an upcoming MD appt. And has CAPS paperwork for MD to complete.  CSW will ask Clinic CSW Constance Holster to f/u with pt and CAPS application.  Mom given contact info for CAPS Social Worker, as well as Advertising account executive.  Emotional support offered.

## 2014-02-07 NOTE — Progress Notes (Signed)
Dr. Jerline Pain at bedside to see pt. Will continue to monitor pt. Ranelle Oyster, RN

## 2014-02-07 NOTE — Progress Notes (Signed)
First treatment was not done due to Pt being short of breath.  And mother requesting [place] to wait

## 2014-02-07 NOTE — Progress Notes (Signed)
Occupational Therapy Treatment Patient Details Name: Jeremy Camacho MRN: [id number] DOB: 1991-02-14 Today's Date: 02/07/2014    History of present illness Patient is a 23 y/o male admitted for right sided chest pain and yellow sputum production for two days. Pt presenting with suspected CAP vs viral respiratory infection. PMH is significant for Duchenne Muscular Dystrophy (followed by Dr. Vallarie Mare at [facility]), CAP (last in 08/2012), GERD, Seasonal Allergies and Right Great Toe Ulceration / Skin breakdown.    OT comments  Patient received supine in bed with lethargy, stating he didn't get good nights sleep. Patient seen for education regarding pre-fabricated splint>right wrist (education provided to patient and mother). This therapist wrote orders to don splint for 2 hours, doff for 2 hours and check for redness. If redness does not go away within 20 minutes need to notify/page OT. Notified RN and NT of orders. Also made mother aware of this splint/OT protocol. Mother verbalized understanding. Also discussed w/c vendors with mother, mother stated his current vendor is [employer]; discussed possible head controls for future (if applicable). Would like to follow-up with patient tomorrow to make sure protocol is being met and no skin integrity issues with RUE.    Follow Up Recommendations  No OT follow up    Equipment Recommendations  None recommended by OT    Recommendations for Other Services  none at this time.     Precautions / Restrictions Precautions Precautions: None Precaution Comments: Contractures present BUEs/LEs. Restrictions Weight Bearing Restrictions: No              ADL Overall ADL's : At baseline;Needs assistance/impaired                                       General ADL Comments: Patient requires total assist for BADLs, no BADL goals set at this time.                Cognition   Behavior During Therapy: WFL for tasks assessed/performed Overall  Cognitive Status: Within Functional Limits for tasks assessed                  Pertinent Vitals/ Pain       Pain Assessment: No/denies pain         Frequency Min 1X/week     Progress Toward Goals  OT Goals(current goals can now be found in the care plan section)  Progress towards OT goals: Progressing toward goals  Acute Rehab OT Goals Patient Stated Goal: none stated OT Goal Formulation: With patient/family Time For Goal Achievement: 02/13/14 Potential to Achieve Goals: Good  Plan Discharge plan remains appropriate       End of Session     Activity Tolerance Patient tolerated treatment well;Patient limited by lethargy   Patient Left in bed;with family/visitor present;with call bell/phone within reach   Nurse Communication Other (comment) (slinting precautions and order entered)        Time: 7858-8502 OT Time Calculation (min): 15 min  Charges: OT General Charges $OT Visit: 1 Procedure OT Treatments $Therapeutic Exercise: 8-22 mins  Chemeka Filice , MS, OTR/L, CLT  02/07/2014, 3:26 PM

## 2014-02-07 NOTE — Progress Notes (Signed)
Received call to see patient after developing new shortness of breath. By the time I arrived in the room, patient was already placed on 2L [place] and was satting in the mid 90s. Patient denied chest pain. Nasal cannula was removed and the patient's sats dropped into the mid 80s. Venturi mask was then placed at 2L and the patient maintained sats in the mid to upper 90s. Reassured patient and mother that he was getting good oxygen to his blood. At this time patient stated that he felt like he was breathing better.   PE: CV: Tachycardic, no murmurs PULM: Poor inspiratory effort. CTAB with no crackles or wheezes  A/P: Appears to be some component of anxiety as patient's sats improved with reassurance. Unlikely to be PE given negative CTA yesterday. Will obtain CXR to ensure no worsening infiltrates or atelectasis. Continue supplemental oxygen for now.   Algis Greenhouse. Jerline Pain, [place] Resident PGY-1 02/07/2014 1:13 AM

## 2014-02-07 NOTE — Discharge Summary (Signed)
FMTS ATTENDING  NOTE Kassi Esteve,MD I  have seen and examined this patient, reviewed their chart. I have discussed this patient with the resident. I agree with the resident's findings, assessment and care plan. 

## 2014-02-07 NOTE — Discharge Instructions (Signed)
You have been admitted for treatment of Community Acquired Pneumonia.   You will be prescribed an antibiotic. Take this antibiotic once daily for 3 more days until 9/20.   You will be seen for hospital follow up by Dr. Venetia Maxon on 9/23 at 2:30 pm.   Additionally, as we discussed, please use the CPAP device at home while K[place]on is sleeping.    Thanks for letting us take care of you!

## 2014-02-07 NOTE — Discharge Summary (Signed)
Family Medicine Teaching [facility] Discharge Summary  Patient name: Jeremy Camacho Medical record number: [id number] Date of birth: [birth year] Age: 23 y.o. Gender: male Date of Admission: 02/05/2014  Date of Discharge: 02/07/2014 Admitting Physician: Janit Pagan, MD  Primary Care Provider: Maryjean Ka, MD Consultants: None  Indication for Hospitalization: Community Acquired Pneumonia  Discharge Diagnoses/Problem List:  Duchenne Muscular Dystrophy  Disposition: To Home  Discharge Condition: Stable  Discharge Exam: Filed Vitals:   02/07/14 0536  BP: 114/70  Pulse: 93  Temp: 98.5 F (36.9 C)  Resp: 20   Physical Exam:  General: NAD, AAOx3  Cardiovascular: RRR, No MGR, S1 / S2 normal  Respiratory: CTA Bilaterally, No crackles, No rales, No TTP, Appropriate rate, No increased WOB  Abdomen: Soft, Nontender, G-Tube in place, Nondistended, No organomegally  Extremities: WWP, 2+ distal pulses bilaterally, Emaciated, Very little muscle left, Little ability to move at this point perhaps 1/5 motor strength due to muscle wasting. Scab over R Great toe noted, no purulence or TTP on exam. Stable.   Brief Hospital Course:  Jeremy Camacho is a 23 y.o. male presenting with suspected early CAP. PMH is significant for Duchenne Muscular Dystrophy (followed by Dr. Alphonzo Dublin at [facility]), CAP (last in 08/2012), GERD, Seasonal Allergies, Right Great Toe Ulceration / Skin breakdown   #Suspected CAP vs. Viral Respiratory Infection - Pt. Presents with two days of yellow sputum production, and right sided chest discomfort in addition to decreased PO intake. ED evaluation revealed pt. Afebril, normotensive, but tachycardic to 120's with subsequent improvement with IVF. Laboratory evaluation revealed WBC 3.5. Given the patient's tachycardia and relative immobility, a D-dimer was drawn which was 0.75 and mildly elevated. CTA was performed which showed possible scarring of the lung parenchyma vs.  Possible RLL pneumonia. At that time he was admitted for IV antibiotics with Ceftriaxone and Azithromycin. He was given Robitussin DM, and Zantac per his home regimen. He was transitioned to po antibiotics. He was found to have an O2 requirement at night due to saturations 89-91% while sleeping due to hypoventilation and weakness. The patient does have a c-pap at home that he has used previously while sleeping. He will restart use of CPAP at home. He has bee very clinically stable, and was found to be safe and ready for discharge.   #Duchenne Muscular Dystrophy - Pt. Is very frail and thin at this time. He is followed by Dr. Alphonzo Dublin at [facility] where he was last seen in 08/2013. He has had an ongoing workup there including recommendation of Echocardiogram for further cardiac workup, however he has not received this yet. Pt. Is able to swallow food on his own at this point, however he has a g-tube placed, though he has not used it for about 3 years. He says that he is at his baseline overall. He plans to continue to follow up with Dr. Alphonzo Dublin. He did have an initial EKG with increased voltage, but no other abnormalities. Echocardiogram was done during this admission and found to be essentially normal. Pt. Safe and stable for discharge.   #Right great toe ulcer: This problem was present on admission, and is recurrent. Has been treated with antibiotics previously. His mother denies ever having his toenail removed. It appears that his is a problem with the toenail growing into the skin and being a nidus for infection. Will likely need his toenail clipped / removed as an outpatient. To be addressed at hospital follow up and management per his primary  care physician.   #Severe Malnutrition - On admission this patient is appearing overall emaciated. He endorses decreased po intake with overall decline in functional ability. He has a g-tube placed, but has not used it in 3-4 years. Upon further evaluation, it appears  he has lost >48% of his body mass in less than 18 months. Nutrition was consulted and recommended diet supplementation, but additionally were unsure if this was the result of potential neglect vs. Inability to receive adequate support due to other social barriers. The social situation remains somewhat murky, however patient is safe for discharge at this time with close outpatient follow up and social work evaluation as an outpatient. Laboratory evaluation revealed normal total protein, marginally low albumin at 3.4, normal Mg, Phos, and normal TSH. Will continue diet supplementation at discharge. Continue outpatient management / follow up as indicated. Despite overall very ill appearance, this may unfortunately be the course of his disease. He is now safe and stable for discharge.   Issues for Follow Up:  1. Community Acquired Pneumonia - will continue po course antibiotics. Follow up respiratory status.  2. Duchenne's Muscular Dystrophy - needs copy of Echo report to take to Dr. Vallarie Mare, Also needs home health set up.  3. Malnutrition - weight loss >48% in 18 months, difficult to tell if this is directly a cause of DMD or if there is some social barrier / neglect component. Social work unable to evaluate as an inpatient. May benefit from outpatient evaluation.   Significant Procedures: None  Significant Labs and Imaging:   Recent Labs Lab 02/05/14 1219 02/06/14 0547  WBC 3.5* 3.8*  HGB 14.2 12.2*  HCT 43.8 38.4*  PLT 169 163    Recent Labs Lab 02/05/14 1219 02/05/14 1828 02/06/14 0547  NA 140  --  141  K 4.2  --  3.4*  CL 99  --  102  CO2 28  --  31  GLUCOSE 140*  --  127*  BUN 10  --  6  CREATININE <0.20*  --  <0.20*  CALCIUM 9.5  --  8.3*  MG  --  1.7  --   PHOS  --  3.3  --   ALKPHOS  --   --  66  AST  --   --  30  ALT  --   --  17  ALBUMIN  --   --  3.4*    EKG 9/15: Enlarged R atria, LVH, RVH, Increased voltage likely 2/2 proximity of leads to the heart, Inverted  T-waves V1-V3 without reciprocal changes, poor R-wave progression. In the setting of no chest pain.   CXR 9/17:  CLINICAL DATA: Increased oxygen requirement.  EXAM:  PORTABLE CHEST - 1 VIEW  COMPARISON: Chest radiograph and CTA of the chest performed  02/05/2014  FINDINGS:  The lungs are well-aerated and clear. There is no evidence of focal  opacification, pleural effusion or pneumothorax.  The cardiomediastinal silhouette is within normal limits. No acute  osseous abnormalities are seen. Thoracic spinal fusion hardware is  partially imaged.  IMPRESSION:  No acute cardiopulmonary process seen.  Electronically Signed  By: Garald Balding M.D.  On: 02/07/2014 06:07  CXR 9/15:  CLINICAL DATA: Difficulty breathing  EXAM:  CHEST - 1 VIEW  COMPARISON: March 23, 2013  FINDINGS:  There is no edema or consolidation. Heart size and pulmonary  vascularity are normal. No adenopathy. There is rod fixation  throughout the thoracic region.  IMPRESSION:  No edema or consolidation.  Electronically  Signed  By: Lowella Grip M.D.  On: 02/05/2014 11:09  Echo 9/17:  Study Conclusions  - Left ventricle: The cavity size was normal. Wall thickness was normal. Systolic function was low normal to mildly decreased. The estimated ejection fraction was in the range of 50% to 55%. Wall motion was normal; there were no regional wall motion abnormalities. Left ventricular diastolic function parameters were normal. - Aortic valve: There was no stenosis. - Mitral valve: There was no regurgitation. - Right ventricle: The cavity size was normal. Systolic function was normal. - Pulmonary arteries: PA peak pressure: 15 mm Hg (S). - Inferior vena cava: The vessel was normal in size. The respirophasic diameter changes were in the normal range (>= 50%), consistent with normal central venous pressure. - Pericardium, extracardiac: A trivial pericardial effusion was identified.  Impressions:  -  Technically difficult study with poor endocardial definition. Normal LV size with probably low normal to mildly reduced systolic function, EF 79-15%. Normal diastolic indices. Normal RV size and systolic function. No significant valvular abnormalities.   Results/Tests Pending at Time of Discharge: None  Discharge Medications:    Medication List    ASK your doctor about these medications       cetirizine 1 MG/ML syrup  Commonly known as:  ZYRTEC  Take 5 mg by mouth at bedtime.     ibuprofen 100 MG/5ML suspension  Commonly known as:  ADVIL,MOTRIN  Take 200 mg by mouth every 6 (six) hours as needed (headache).     ranitidine 15 MG/ML syrup  Commonly known as:  ZANTAC  Take 60 mg by mouth at bedtime. 4 mls        Discharge Instructions: Please refer to Patient Instructions section of EMR for full details.  Patient was counseled important signs and symptoms that should prompt return to medical care, changes in medications, dietary instructions, activity restrictions, and follow up appointments.   Follow-Up Appointments:   Aquilla Hacker, MD 02/07/2014, 8:42 AM PGY-1, [place]

## 2014-02-07 NOTE — Progress Notes (Signed)
Pt states he feels much better now. Placed pt on venturi mask at 2L per Dr. Marigene Ehlers orders. Will continue to monitor pt. Ranelle Oyster, RN

## 2014-02-07 NOTE — Progress Notes (Signed)
MD oncall stated that pt was not being d/c tonight and was staying here tonight due to mother's request. MD stated to leave pt's IVF turned off tonight and to keep NSL in place tonight. Will continue to monitor pt. Ranelle Oyster, RN

## 2014-02-07 NOTE — Progress Notes (Signed)
Patient's peripheral IV will be outdated tonight and requesting to keep current peripheral IV in. MD aware.

## 2014-02-07 NOTE — Progress Notes (Signed)
Notified Dr. Jerline Pain oncall that pt states he is having shortness of breath. Pt's lungs are clear bilateral. Pt placed on 2L of O2. O2 sat is 97% on 2L of O2. Pt states he is feeling better. Pt's mother and pt requesting for MD to see him.

## 2014-02-07 NOTE — Progress Notes (Signed)
Orthopedic Tech Progress Note Patient Details:  Jeremy Camacho January 06, 1991 [id number] Splint orders apply to Occupational Therapy, not Ortho Tech Patient ID: Jeremy Camacho, male   DOB: [birth year], 24 y.o.   MRN: [id number]   Fenton Foy 02/07/2014, 3:55 PM

## 2014-02-07 NOTE — Care Management Note (Addendum)
    Page 1 of 1   02/07/2014     2:44:02 PM CARE MANAGEMENT NOTE 02/07/2014  Patient:  Jeremy Camacho, Jeremy Camacho   Account Number:  [id number]  Date Initiated:  02/07/2014  Documentation initiated by:  Jeremy Camacho  Subjective/Objective Assessment:   dx cap, musclar dystrophy  admit- lives with parents.  Mom has applied for Cap services. Mom is caretaker.     Action/Plan:   Anticipated DC Date:  02/08/2014   Anticipated DC Plan:  [place]  CM consult      Choice offered to / List presented to:             Status of service:  In process, will continue to follow Medicare Important Message given?  NO (If response is "NO", the following Medicare IM given date fields will be blank) Date Medicare IM given:   Medicare IM given by:   Date Additional Medicare IM given:   Additional Medicare IM given by:    Discharge Disposition:    Per UR Regulation:  Reviewed for med. necessity/level of care/duration of stay  If discussed at [place] of Stay Meetings, dates discussed:    Comments:  02/07/14 [place], BSN [phone] patient lives with mom, who is his care taker, patient has medicaid, which medicaid does not cover physical therapy unless patient has a cva or fx on this admission, which he does not have.  NCM asked Mom and patient is they would like to have a [facility] which it would pay for , they stated no.  NCM informed Staff RN of this information.  Patient has a power w/chair, which can go into parents Lucianne Lei for transportation.

## 2014-02-08 DIAGNOSIS — K2100 Gastro-esophageal reflux disease with esophagitis, without bleeding: Secondary | ICD-10-CM

## 2014-02-08 DIAGNOSIS — E43 Unspecified severe protein-calorie malnutrition: Secondary | ICD-10-CM

## 2014-02-08 NOTE — Discharge Summary (Signed)
[facility] Discharge Summary  Patient name: Jeremy Camacho Medical record number: [id number] Date of birth: 01-Jul-1990 Age: 23 y.o. Gender: male Date of Admission: 02/05/2014  Date of Discharge: 02/08/2014 Admitting Physician: Andrena Mews, MD  Primary Care Provider: Christa See, MD Consultants: None  Indication for Hospitalization: Community Acquired Pneumonia   ------------- Updated from D/C Summary on 9/18 Pt. Did not leave overnight due to concern about desaturing overnight again in spite of our reassurance that he was safe for discharge ---------   Discharge Diagnoses/Problem List:  Duchenne Muscular Dystrophy  Disposition: To Home  Discharge Condition: Stable  Discharge Exam: Filed Vitals:   02/08/14 0628  BP: 116/82  Pulse: 93  Temp: 98.3 F (36.8 C)  Resp: 20   Physical Exam:  General: NAD, AAOx3  Cardiovascular: RRR, No MGR, S1 / S2 normal, enlarged PMI on exam  Respiratory: CTA Bilaterally, No crackles, No rales, No TTP, Appropriate rate, No increased WOB, pectus excavatum deformity  Abdomen: Soft, Nontender, G-Tube in place, Nondistended, No organomegally  Extremities: WWP, 2+ distal pulses bilaterally, Emaciated, Very little muscle left, Little ability to move at this point perhaps 1/5 motor strength due to muscle wasting. Scab over R Great toe noted, no purulence or TTP on exam. Stable.   Brief Hospital Course:  Jeremy Camacho is a 23 y.o. male presenting with suspected early CAP. PMH is significant for Duchenne Muscular Dystrophy (followed by Dr. Vallarie Mare at [facility]), CAP (last in 08/2012), GERD, Seasonal Allergies, Right Great Toe Ulceration / Skin breakdown   #Suspected CAP vs. Viral Respiratory Infection - Pt. Presents with two days of yellow sputum production, and right sided chest discomfort in addition to decreased PO intake. ED evaluation revealed pt. Afebril, normotensive, but tachycardic to 120's with subsequent  improvement with IVF. Laboratory evaluation revealed WBC 3.5. Given the patient's tachycardia and relative immobility, a D-dimer was drawn which was 0.75 and mildly elevated. CTA was performed which showed possible scarring of the lung parenchyma vs. Possible RLL pneumonia. At that time he was admitted for IV antibiotics with Ceftriaxone and Azithromycin. He was given Robitussin DM, and Zantac per his home regimen. He was transitioned to po antibiotics. He was found to have an O2 requirement at night due to saturations 89-91% while sleeping due to hypoventilation and weakness. The patient does have a c-pap at home that he has used previously while sleeping. He will restart use of CPAP at home. He has bee very clinically stable, and was found to be safe and ready for discharge.   #Duchenne Muscular Dystrophy - Pt. Is very frail and thin at this time. He is followed by Dr. Vallarie Mare at [facility] where he was last seen in 08/2013. He has had an ongoing workup there including recommendation of Echocardiogram for further cardiac workup, however he has not received this yet. Pt. Is able to swallow food on his own at this point, however he has a g-tube placed, though he has not used it for about 3 years. He says that he is at his baseline overall. He plans to continue to follow up with Dr. Vallarie Mare. He did have an initial EKG with increased voltage, but no other abnormalities. Echocardiogram was done during this admission and found to be essentially normal. Pt. Safe and stable for discharge.   #Right great toe ulcer: This problem was present on admission, and is recurrent. Has been treated with antibiotics previously. His mother denies ever having his toenail removed. It appears  that his is a problem with the toenail growing into the skin and being a nidus for infection. Will likely need his toenail clipped / removed as an outpatient. To be addressed at hospital follow up and management per his primary care physician.    #Severe Malnutrition - On admission this patient is appearing overall emaciated. He endorses decreased po intake with overall decline in functional ability. He has a g-tube placed, but has not used it in 3-4 years. Upon further evaluation, it appears he has lost >48% of his body mass in less than 18 months. Nutrition was consulted and recommended diet supplementation, but additionally were unsure if this was the result of potential neglect vs. Inability to receive adequate support due to other social barriers. The social situation remains somewhat murky, however patient is safe for discharge at this time with close outpatient follow up and social work evaluation as an outpatient. Laboratory evaluation revealed normal total protein, marginally low albumin at 3.4, normal Mg, Phos, and normal TSH. Will continue diet supplementation at discharge. Continue outpatient management / follow up as indicated. Despite overall very ill appearance, this may unfortunately be the course of his disease. He is now safe and stable for discharge.   Issues for Follow Up:  1. Community Acquired Pneumonia - will continue po course antibiotics. Follow up respiratory status.  2. Duchenne's Muscular Dystrophy - needs copy of Echo report to take to Dr. Vallarie Mare, Also needs home health set up.  3. Malnutrition - weight loss >48% in 18 months, difficult to tell if this is directly a cause of DMD or if there is some social barrier / neglect component. Social work unable to evaluate as an inpatient. May benefit from outpatient evaluation.   Significant Procedures: None  Significant Labs and Imaging:   Recent Labs Lab 02/05/14 1219 02/06/14 0547  WBC 3.5* 3.8*  HGB 14.2 12.2*  HCT 43.8 38.4*  PLT 169 163    Recent Labs Lab 02/05/14 1219 02/05/14 1828 02/06/14 0547  NA 140  --  141  K 4.2  --  3.4*  CL 99  --  102  CO2 28  --  31  GLUCOSE 140*  --  127*  BUN 10  --  6  CREATININE <0.20*  --  <0.20*  CALCIUM 9.5   --  8.3*  MG  --  1.7  --   PHOS  --  3.3  --   ALKPHOS  --   --  66  AST  --   --  30  ALT  --   --  17  ALBUMIN  --   --  3.4*    EKG 9/15: Enlarged R atria, LVH, RVH, Increased voltage likely 2/2 proximity of leads to the heart, Inverted T-waves V1-V3 without reciprocal changes, poor R-wave progression. In the setting of no chest pain.   CXR 9/17:  CLINICAL DATA: Increased oxygen requirement.  EXAM:  PORTABLE CHEST - 1 VIEW  COMPARISON: Chest radiograph and CTA of the chest performed  02/05/2014  FINDINGS:  The lungs are well-aerated and clear. There is no evidence of focal  opacification, pleural effusion or pneumothorax.  The cardiomediastinal silhouette is within normal limits. No acute  osseous abnormalities are seen. Thoracic spinal fusion hardware is  partially imaged.  IMPRESSION:  No acute cardiopulmonary process seen.  Electronically Signed  By: Garald Balding M.D.  On: 02/07/2014 06:07  CXR 9/15:  CLINICAL DATA: Difficulty breathing  EXAM:  CHEST - 1 VIEW  COMPARISON: March 23, 2013  FINDINGS:  There is no edema or consolidation. Heart size and pulmonary  vascularity are normal. No adenopathy. There is rod fixation  throughout the thoracic region.  IMPRESSION:  No edema or consolidation.  Electronically Signed  By: Lowella Grip M.D.  On: 02/05/2014 11:09  Echo 9/17:  Study Conclusions  - Left ventricle: The cavity size was normal. Wall thickness was normal. Systolic function was low normal to mildly decreased. The estimated ejection fraction was in the range of 50% to 55%. Wall motion was normal; there were no regional wall motion abnormalities. Left ventricular diastolic function parameters were normal. - Aortic valve: There was no stenosis. - Mitral valve: There was no regurgitation. - Right ventricle: The cavity size was normal. Systolic function was normal. - Pulmonary arteries: PA peak pressure: 15 mm Hg (S). - Inferior vena cava: The  vessel was normal in size. The respirophasic diameter changes were in the normal range (>= 50%), consistent with normal central venous pressure. - Pericardium, extracardiac: A trivial pericardial effusion was identified.  Impressions:  - Technically difficult study with poor endocardial definition. Normal LV size with probably low normal to mildly reduced systolic function, EF 44-03%. Normal diastolic indices. Normal RV size and systolic function. No significant valvular abnormalities.   Results/Tests Pending at Time of Discharge: None  Discharge Medications:    Medication List         cetirizine 1 MG/ML syrup  Commonly known as:  ZYRTEC  Take 5 mg by mouth at bedtime.     guaiFENesin-dextromethorphan 100-10 MG/5ML syrup  Commonly known as:  ROBITUSSIN DM  Take 5 mLs by mouth every 4 (four) hours as needed for cough.     ibuprofen 100 MG/5ML suspension  Commonly known as:  ADVIL,MOTRIN  Take 200 mg by mouth every 6 (six) hours as needed (headache).     levofloxacin 25 MG/ML solution  Commonly known as:  LEVAQUIN  Take 30 mLs (750 mg total) by mouth daily.     ranitidine 15 MG/ML syrup  Commonly known as:  ZANTAC  Take 60 mg by mouth at bedtime. 4 mls        Discharge Instructions: Please refer to Patient Instructions section of EMR for full details.  Patient was counseled important signs and symptoms that should prompt return to medical care, changes in medications, dietary instructions, activity restrictions, and follow up appointments.   Follow-Up Appointments: Follow-up Information   Follow up with Christa See, MD On 02/13/2014. [facility] Follow Up Appointment at 2:30pm on 9/23)    Specialty:  Family Medicine   Contact information:   [place] [address] [phone]       Aquilla Hacker, MD 02/08/2014, 7:24 AM PGY-1, [place]

## 2014-02-08 NOTE — Discharge Summary (Signed)
FMTS ATTENDING  NOTE Sequoyah Counterman,MD I  have seen and examined this patient, reviewed their chart. I have discussed this patient with the resident. I agree with the resident's findings, assessment and care plan. 

## 2014-02-08 NOTE — Progress Notes (Signed)
NURSING PROGRESS NOTE  JUNIPER SNYDERS [id number] Discharge Data: 02/08/2014 12:55 PM Attending Provider: Andrena Mews, MD EFU:WTKTCC, Harrell Gave, MD   Paris Lore to be D/C'd Home per MD order.    All IV's will be discontinued and monitored for bleeding.  All belongings will be returned to patient for patient to take home.  Last Documented Vital Signs:  Blood pressure 116/82, pulse 93, temperature 98.3 F (36.8 C), temperature source Oral, resp. rate 20, height $RemoveBe'5\' 6"'aGqOTPqgl$  (1.676 m), weight 27.216 kg (60 lb), SpO2 98.00%.  Joslyn Hy, MSN, RN, [employer]

## 2014-02-08 NOTE — Progress Notes (Signed)
Occupational Therapy Treatment Patient Details Name: Jeremy Camacho MRN: [id number] DOB: 1991-01-15 Today's Date: 02/08/2014    History of present illness Patient is a 23 y/o male admitted for right sided chest pain and yellow sputum production for two days. Pt presenting with suspected CAP vs viral respiratory infection. PMH is significant for Duchenne Muscular Dystrophy (followed by Dr. Vallarie Mare at [facility]), CAP (last in 08/2012), GERD, Seasonal Allergies and Right Great Toe Ulceration / Skin breakdown.    OT comments  Discussed with pt. Caregiver/mother on use of splint and compliance with splinting protocol. Pt. Mother states that they are following 2 hours on and 2 hours off guidelines. Pt. Mother states that he is having minimal skin redness that disappears within 3-4 min. Pt. Mother is pleased with current splinting protocol.   Follow Up Recommendations       Equipment Recommendations       Recommendations for Other Services      Precautions / Restrictions Precautions Precautions: None Precaution Comments: Contractures present BUEs/LEs. Restrictions Weight Bearing Restrictions: No       Mobility Bed Mobility                  Transfers                      Balance                                   ADL Overall ADL's : At baseline                                              Vision                     Perception     Praxis      Cognition   Behavior During Therapy: WFL for tasks assessed/performed Overall Cognitive Status: Within Functional Limits for tasks assessed                       Extremity/Trunk Assessment               Exercises     Shoulder Instructions       General Comments      Pertinent Vitals/ Pain       Pain Assessment: No/denies pain  Home Living                                          Prior Functioning/Environment               Frequency       Progress Toward Goals  OT Goals(current goals can now be found in the care plan section)        Plan Discharge plan remains appropriate    Co-evaluation                 End of Session     Activity Tolerance     Patient Left     Nurse Communication          Time: 0950-1006 OT Time Calculation (min): 16 min  Charges: OT Treatments $Therapeutic Activity: 8-22 mins  Levone Otten  02/08/2014, 10:10 AM

## 2014-02-13 ENCOUNTER — Encounter: Payer: Self-pay | Admitting: Family Medicine

## 2014-02-13 ENCOUNTER — Ambulatory Visit (INDEPENDENT_AMBULATORY_CARE_PROVIDER_SITE_OTHER): Payer: Medicaid Other | Admitting: Family Medicine

## 2014-02-13 ENCOUNTER — Telehealth: Payer: Self-pay | Admitting: Family Medicine

## 2014-02-13 VITALS — BP 117/93 | HR 114 | Temp 97.7°F

## 2014-02-13 DIAGNOSIS — J189 Pneumonia, unspecified organism: Secondary | ICD-10-CM

## 2014-02-13 DIAGNOSIS — G7100 Muscular dystrophy, unspecified: Secondary | ICD-10-CM

## 2014-02-13 DIAGNOSIS — G7109 Other specified muscular dystrophies: Secondary | ICD-10-CM

## 2014-02-13 DIAGNOSIS — E43 Unspecified severe protein-calorie malnutrition: Secondary | ICD-10-CM

## 2014-02-13 MED ORDER — AEROCHAMBER PLUS FLO-VU LARGE MISC: Status: AC

## 2014-02-13 MED ORDER — ALBUTEROL SULFATE HFA 108 (90 BASE) MCG/ACT IN AERS: 2.0000 | INHALATION_SPRAY | Freq: Four times a day (QID) | RESPIRATORY_TRACT | Status: AC | PRN

## 2014-02-13 MED ORDER — ONDANSETRON HCL 4 MG/5ML PO SOLN: 4.0000 mg | Freq: Three times a day (TID) | ORAL | Status: AC | PRN

## 2014-02-13 NOTE — Assessment & Plan Note (Signed)
Related to MD and poor PO intake. No apparent neglect or mistreatment, though pt is extremely emaciated. Instructed to f/u with muscular dystrophy specialist, and will order home health assessment to see if further community resources may be of help.

## 2014-02-13 NOTE — Progress Notes (Signed)
   Subjective:    Patient ID: Jeremy Camacho, male    DOB: 1991/02/04, 23 y.o.   MRN: [id number]  HPI: Pt presents to clinic for HFU for CAP. See hospital problem list notes and D/C summary. Pt reports he is "better than he was," but still not feeling well yet. He has thick saliva but no frank productive sputum. He has been taking Levaquin and has several days left; he has had some stomach upset with this but does not have nausea medication at home. He and mother question whether or not he needs oxygen at home; he has a CPAP, which has has used intermittently in the past (i.e., has used inconsistently), as he feels like it "takes" his breath rather than helping, and he has had some relief of SOB-type symptoms with [place] O2 while in the hospital. He has had no further fevers, vomiting, or change in his bowel habits. He has had "okay appetite" but does not eat well (only two meals and maybe 1 snack per day) "for a while, now," and has lost significant weight in the past year (see dietician notes from hospital stay; concern for 48% body weight loss over 1 year). Pt has a history of muscular dystrophy and sees Dr. Freida Busman at [facility], though has not seen him recently.  Of note, mother requests a home health aide / home health services, as she has difficulty managing Jeremy Camacho's care on her own.  Review of Systems: As above.     Objective:   Physical Exam BP 117/93  Pulse 114  Temp(Src) 97.7 F (36.5 C) (Oral) Gen: chronically ill and very frail-appearing young adult male in NAD; wheelchair-bound, very emaciated  HEENT: [place]/AT, sclerae/conjunctivae clear, no lid lag, EOMI, PERRLA; able to support head on his own   MMM, posterior oropharynx clear, no cervical lymphadenopathy   neck with no masses appreciated; thyroid not enlarged  Cardio: RRR, no murmur appreciated; distal pulses intact/symmetric  Pulm: CTAB, no wheezes, though pt does take shallow breaths  Air movement / lung sounds are clearer when pt  deliberately takes deeper breaths Abd: soft, nondistended, BS+, no HSM; G-tube in place without surrounding erythema/edema  Ext: warm/well-perfused, no cyanosis/clubbing/edema  very thin extremities with very little muscle mass, especially in LE bilaterally  MSK: numerous contractures, most pronounced in LE; able to move UE some, mostly hands, at baseline mobility  Neuro/Psych: alert/oriented, mood euthymic with congruent affect     Assessment & Plan:  See problem list notes.

## 2014-02-13 NOTE — Patient Instructions (Signed)
Thank you for coming in, today!  I want Jeremy Camacho to continue the antibiotic until he finishes it. I will write a prescription for him to take for nausea or vomiting every 8 hours as needed (liquid ondansetron, generic for Zofran). I will also write a prescription for an inhaler and a spacer to use every 4-6 hours as needed for shortness of breath.  I will complete the form requesting a home-health aide, today. I will fax it when it's done. I will also put in a referral for home health to come evaluate your home situation. They will help [place] figure out what services would be of help for him (nursing, aides, respiratory therapy, etc).  Call Dr. Freida Busman to get an appointment at [facility] as soon as possible. They may be able to help more with home services, as well.  Please feel free to call with any questions or concerns at any time, at [phone]. --Dr. Venetia Maxon

## 2014-02-13 NOTE — Telephone Encounter (Signed)
Needs copy of paperwork that was sent on 01-23-14. This was to be filled out after todays visit [place] [phone]

## 2014-02-13 NOTE — Assessment & Plan Note (Signed)
A: Slowly improving with PO Levaquin, though still not at baseline breathing status, and with some stomach upset due to antibiotics. No fevers, lungs generally clear, though generally very poor nutrition status may slow recovery.  P: Encouraged turn-cough IS, and complete course of Levaquin. Rx for albuterol and spacer PRN for help with SOB. Rx for Zofran for nausea while taking Levaquin. Advised close f/u or representation to the ED if symptoms continue, return, or worsen.

## 2014-02-13 NOTE — Assessment & Plan Note (Signed)
A: Generally at baseline functioning, though pt has had significant weight loss and general decline especially over the last year. Primary muscular dystrophy doctor is Dr. Vallarie Mare at [facility], though pt has not seen him recently. Pt and mother request home health services if possible. Pt is generally noncompliant with CPAP at night, but feels O2 helped at night in the hospital.  P: Continue current medications; see separate problem list notes. Strongly encouraged close f/u with Dr. Vallarie Mare ASAP to help with home services and to evaluate for what measures are appropriate for MD if it is progressing as it seems to be. Will order for home health services assessment, and completed and faxed home aide request, today. Pt to f/u as needed, otherwise.

## 2014-02-14 ENCOUNTER — Telehealth: Payer: Self-pay | Admitting: Family Medicine

## 2014-02-14 NOTE — Telephone Encounter (Signed)
Called Ms. Sexton back and faxed copy of form to her, today. Thanks. --CMS

## 2014-02-14 NOTE — Telephone Encounter (Signed)
Red Team: Please call pt's mother and let her know that a copy of his office note from 9/23 is left at the front desk for her to pick up, at her request. Thanks. --CMS

## 2014-02-15 ENCOUNTER — Telehealth: Payer: Self-pay | Admitting: Clinical

## 2014-02-15 NOTE — Telephone Encounter (Signed)
[facility] referral for an RN has been sent and accept by [place] who will see patient on Sunday 02/17/14. Referral to [facility] also placed.  Hunt Oris, MSW, [place]

## 2014-02-15 NOTE — Telephone Encounter (Signed)
Pt's mother called with concerns of pt's "breathing was labored, I don't know if his HR is elevated, and he's not going to the bathroom." CSW informed pt's mother that it would be best that she either call to bring him to an appt or take him to the ED. CSW confirmed with PCP that if mother is concerned she needs to proceed with the ED.   CSW also sent a [facility] RN referral to [place].  Mother agreeable and appreciative.  Hunt Oris, MSW, [place]

## 2014-02-15 NOTE — Telephone Encounter (Signed)
Spoke with Leda Gauze, CSW and discussed. Form for PCS has already been completed and faxed to [facility], earlier this week. Pt has ongoing needs for issues related to muscular dystrophy related to nutrition, dependence on others for any mobility, etc, so will opt for referral to [facility] to see what other services may be helpful. Much appreciate assistance from all parties in this matter!!! --CMS

## 2014-02-15 NOTE — Telephone Encounter (Signed)
CSW made aware by [facility] that an aide would not be covered through [facility].   CSW contacted pt's mother to get clarity on the services she actually needs. Mother states she needs an aide. CSW informed pt's mother that an aide would have to be requested through [facility] and CAP. Pt's mother confirmed she is currently working on the CAP application (waiting list is 2 years+) and is not receiving and aide through [facility]. CSW has completed an application for PCS and placed it in PCP's box.  Hunt Oris, MSW, [place]

## 2014-02-18 ENCOUNTER — Telehealth: Payer: Self-pay | Admitting: Family Medicine

## 2014-02-18 NOTE — Telephone Encounter (Signed)
Placed in MDs box to be filled out. Jeremy Camacho CMA

## 2014-02-18 NOTE — Telephone Encounter (Signed)
Mother brought in forms to be completed for leave of absence and handicapped placard

## 2014-02-19 ENCOUNTER — Telehealth: Payer: Self-pay | Admitting: *Deleted

## 2014-02-19 ENCOUNTER — Telehealth: Payer: Self-pay | Admitting: Family Medicine

## 2014-02-19 NOTE — Telephone Encounter (Signed)
Jeremy Camacho called and wanted the doctor to know that they have opened a case and started on Sunday with Nursing, Medical social worker, and a home health nurse aide. They will be closely monitoring his situation. jw

## 2014-02-19 NOTE — Telephone Encounter (Signed)
Mom informed that the form is completed and ready for pick up.  [employer] form faxed to number provided.  Forms copied for scanning in pt's record.  Derl Barrow, RN

## 2014-02-19 NOTE — Telephone Encounter (Signed)
FYI to PCP

## 2014-02-19 NOTE — Telephone Encounter (Signed)
Talked with his nurse about pain on the left side that pt was having. Per Dr street tell them to take him to the ER to be evaluated. Nurse informed. She was also going to inform his mom because i got no answer when i called both numbers. Kately Graffam CMA

## 2014-02-21 ENCOUNTER — Telehealth: Payer: Self-pay | Admitting: *Deleted

## 2014-02-21 NOTE — Telephone Encounter (Signed)
Called Arville Go RN and relayed verbal order to use enema(s) at home, as needed. She reports respiratory status is some better (no fever, etc). Will f/u as needed. --CMS

## 2014-02-21 NOTE — Telephone Encounter (Signed)
FMLA paperwork sent back to them but page 12 needed the dates employee needs to be out. Also asking [place] to use a cover sheet with employees name.

## 2014-02-21 NOTE — Telephone Encounter (Signed)
Seeing pt at home, treated in hospital for pneumonia, is having constipation issues, pt has enema but she does not have orders for that.

## 2014-02-22 ENCOUNTER — Telehealth: Payer: Self-pay | Admitting: Clinical

## 2014-02-22 NOTE — Telephone Encounter (Signed)
Question 6 on page 12 corrected and fax re-sent. Thanks. --CMS

## 2014-02-22 NOTE — Telephone Encounter (Signed)
CSW received a message from [place] with [facility] that she contacted pt's mother to provide services however mother stated that at this time the pt is not in need of case management services.  Mother to have an appt with CAP on 02/26/14.  Hunt Oris, MSW, [place]

## 2014-02-28 ENCOUNTER — Emergency Department (HOSPITAL_COMMUNITY): Payer: Medicaid Other

## 2014-02-28 ENCOUNTER — Encounter (HOSPITAL_COMMUNITY): Payer: Self-pay | Admitting: Emergency Medicine

## 2014-02-28 ENCOUNTER — Emergency Department (HOSPITAL_COMMUNITY)
Admission: EM | Admit: 2014-02-28 | Discharge: 2014-02-28 | Disposition: A | Discharge status: Home / Self Care | Payer: Medicaid Other | Attending: Emergency Medicine | Admitting: Emergency Medicine

## 2014-02-28 ENCOUNTER — Telehealth: Payer: Self-pay | Admitting: Family Medicine

## 2014-02-28 DIAGNOSIS — R059 Cough, unspecified: Secondary | ICD-10-CM

## 2014-02-28 DIAGNOSIS — R05 Cough: Secondary | ICD-10-CM | POA: Insufficient documentation

## 2014-02-28 DIAGNOSIS — K219 Gastro-esophageal reflux disease without esophagitis: Secondary | ICD-10-CM | POA: Insufficient documentation

## 2014-02-28 DIAGNOSIS — R011 Cardiac murmur, unspecified: Secondary | ICD-10-CM | POA: Insufficient documentation

## 2014-02-28 DIAGNOSIS — R000 Tachycardia, unspecified: Secondary | ICD-10-CM | POA: Insufficient documentation

## 2014-02-28 DIAGNOSIS — R110 Nausea: Secondary | ICD-10-CM | POA: Insufficient documentation

## 2014-02-28 DIAGNOSIS — R531 Weakness: Secondary | ICD-10-CM | POA: Diagnosis not present

## 2014-02-28 DIAGNOSIS — M791 Myalgia: Secondary | ICD-10-CM | POA: Insufficient documentation

## 2014-02-28 DIAGNOSIS — R630 Anorexia: Secondary | ICD-10-CM | POA: Diagnosis not present

## 2014-02-28 DIAGNOSIS — Z79899 Other long term (current) drug therapy: Secondary | ICD-10-CM | POA: Diagnosis not present

## 2014-02-28 DIAGNOSIS — J189 Pneumonia, unspecified organism: Secondary | ICD-10-CM | POA: Diagnosis present

## 2014-02-28 LAB — COMPREHENSIVE METABOLIC PANEL
ALK PHOS: 64 U/L (ref 39–117)
ALT: 17 U/L (ref 0–53)
AST: 30 U/L (ref 0–37)
Albumin: 4.1 g/dL (ref 3.5–5.2)
Anion gap: 13 (ref 5–15)
BUN: 9 mg/dL (ref 6–23)
CHLORIDE: 97 meq/L (ref 96–112)
CO2: 29 meq/L (ref 19–32)
Calcium: 9.1 mg/dL (ref 8.4–10.5)
Creatinine, Ser: <0.2 mg/dL — ABNORMAL LOW (ref 0.50–1.35)
GLUCOSE: 85 mg/dL (ref 70–99)
POTASSIUM: 4.4 meq/L (ref 3.7–5.3)
Sodium: 139 mEq/L (ref 137–147)
Total Bilirubin: 0.5 mg/dL (ref 0.3–1.2)
Total Protein: 7.9 g/dL (ref 6.0–8.3)

## 2014-02-28 LAB — CBC WITH DIFFERENTIAL/PLATELET
BASOS ABS: 0 10*3/uL (ref 0.0–0.1)
Basophils Relative: 0 % (ref 0–1)
Eosinophils Absolute: 0 10*3/uL (ref 0.0–0.7)
Eosinophils Relative: 1 % (ref 0–5)
HCT: 45.7 % (ref 39.0–52.0)
Hemoglobin: 14.9 g/dL (ref 13.0–17.0)
LYMPHS ABS: 1.1 10*3/uL (ref 0.7–4.0)
LYMPHS PCT: 25 % (ref 12–46)
MCH: 29.7 pg (ref 26.0–34.0)
MCHC: 32.6 g/dL (ref 30.0–36.0)
MCV: 91.2 fL (ref 78.0–100.0)
Monocytes Absolute: 0.7 10*3/uL (ref 0.1–1.0)
Monocytes Relative: 15 % — ABNORMAL HIGH (ref 3–12)
NEUTROS ABS: 2.7 10*3/uL (ref 1.7–7.7)
NEUTROS PCT: 59 % (ref 43–77)
PLATELETS: 201 10*3/uL (ref 150–400)
RBC: 5.01 MIL/uL (ref 4.22–5.81)
RDW: 12.7 % (ref 11.5–15.5)
WBC: 4.6 10*3/uL (ref 4.0–10.5)

## 2014-02-28 MED ORDER — SODIUM CHLORIDE 0.9 % IV BOLUS (SEPSIS)
1000.0000 mL | Freq: Once | INTRAVENOUS | Status: AC
  Administered 2014-02-28: 1000 mL via INTRAVENOUS

## 2014-02-28 NOTE — Telephone Encounter (Signed)
FYI to PCP

## 2014-02-28 NOTE — ED Notes (Signed)
Phlebotomy at bedside.

## 2014-02-28 NOTE — Telephone Encounter (Signed)
Home health nurse with [facility]: has cancelled both appts last week and todays appt Just wanted dr Andria Frames to know

## 2014-02-28 NOTE — Discharge Instructions (Signed)
As discussed, your evaluation today has been largely reassuring.  But, it is important that you monitor your condition carefully, and do not hesitate to return to the ED if you develop new, or concerning changes in your condition.  Otherwise, please follow-up with your physician for appropriate ongoing care.   Cough, Adult  A cough is a reflex. It helps you clear your throat and airways. A cough can help heal your body. A cough can last 2 or 3 weeks (acute) or may last more than 8 weeks (chronic). Some common causes of a cough can include an infection, allergy, or a cold. HOME CARE  Only take medicine as told by your doctor.  If given, take your medicines (antibiotics) as told. Finish them even if you start to feel better.  Use a cold steam vaporizer or humidifier in your home. This can help loosen thick spit (secretions).  Sleep so you are almost sitting up (semi-upright). Use pillows to do this. This helps reduce coughing.  Rest as needed.  Stop smoking if you smoke. GET HELP RIGHT AWAY IF:  You have yellowish-white fluid (pus) in your thick spit.  Your cough gets worse.  Your medicine does not reduce coughing, and you are losing sleep.  You cough up blood.  You have trouble breathing.  Your pain gets worse and medicine does not help.  You have a fever. MAKE SURE YOU:   Understand these instructions.  Will watch your condition.  Will get help right away if you are not doing well or get worse. Document Released: 01/21/2011 Document Revised: 09/24/2013 Document Reviewed: 01/21/2011 [facility] Patient Information 2015 [place], [place]. This information is not intended to replace advice given to you by your health care provider. Make sure you discuss any questions you have with your health care provider.

## 2014-02-28 NOTE — ED Notes (Signed)
Pt's mom also reports pts hasn't been eating a lot lately because he gets tired easily from eating.

## 2014-02-28 NOTE — ED Notes (Signed)
Pt has had cough, productive with white/yellow sputum. Was being treated for pneumonia, doctor sent here for xray to see if it is clearing up. Is a x 4.

## 2014-02-28 NOTE — ED Notes (Signed)
Vanita Panda, MD at bedside.

## 2014-02-28 NOTE — ED Notes (Signed)
Lab states that they never received the blood drawn at 1900.

## 2014-02-28 NOTE — ED Notes (Signed)
This RN found blood tubes on desk in the pt's room, however the blood was not labeled. This RN to draw new blood samples. Family is aware.

## 2014-02-28 NOTE — ED Provider Notes (Signed)
CSN: [id number]     Arrival date & time 02/28/14  1713 History   First MD Initiated Contact with Patient 02/28/14 1802     Chief Complaint  Patient presents with  . Pneumonia      HPI  Patient presents with his mother who provides much of the history of present illness.  Patient has a history of muscular dystrophy. Per report patient had a pneumonia in the past month, and over the past days has had decreased energy, increasing anorexia, mild nausea.  No report of new cough. Patient's physician sent him here for evaluation of possible recurrence of pneumonia versus other causes. The patient denies pain, endorses the aforementioned symptoms.   Past Medical History  Diagnosis Date  . GERD (gastroesophageal reflux disease)   . Heart murmur   . Pneumonia     "several times"  . Duchenne muscular dystrophy   . Seasonal allergies    Past Surgical History  Procedure Laterality Date  . Abdominal surgery      "infection"  . Gastrostomy tube placement      Placed ~2009. Not used since 2011.  Marland Kitchen Spinal fusion    . Hip fracture surgery Left   . Back surgery     Family History  Problem Relation Age of Onset  . Diabetes Maternal Grandfather   . Hypertension Maternal Grandfather    History  Substance Use Topics  . Smoking status: Passive Smoke Exposure - Never Smoker  . Smokeless tobacco: Never Used  . Alcohol Use: 1.8 oz/week    1 Cans of beer, 2 Glasses of wine per week    Review of Systems  Constitutional: Positive for unexpected weight change. Negative for fever and chills.  HENT: Negative for rhinorrhea.   Eyes: Negative for pain.  Respiratory: Negative for cough.   Cardiovascular: Negative for chest pain.  Gastrointestinal: Positive for nausea. Negative for vomiting.  Genitourinary: Negative.   Musculoskeletal: Positive for myalgias.  Skin:       Tiny skin breakdown at L lateral maleolus  Neurological: Positive for weakness.  Psychiatric/Behavioral: Negative.        Allergies  Review of patient's allergies indicates no known allergies.  Home Medications   Prior to Admission medications   Medication Sig Start Date End Date Taking? Authorizing Provider  albuterol (PROVENTIL HFA;VENTOLIN HFA) 108 (90 BASE) MCG/ACT inhaler Inhale 2 puffs into the lungs every 6 (six) hours as needed for wheezing or shortness of breath. 02/13/14  Yes Sharon Mt Street, MD  cetirizine (ZYRTEC) 1 MG/ML syrup Take 5 mg by mouth at bedtime.   Yes Historical Provider, MD  guaiFENesin-dextromethorphan (ROBITUSSIN DM) 100-10 MG/5ML syrup Take 5 mLs by mouth every 4 (four) hours as needed for cough. 02/07/14  Yes Aquilla Hacker, MD  ibuprofen (ADVIL,MOTRIN) 100 MG/5ML suspension Take 200 mg by mouth every 6 (six) hours as needed (headache).   Yes Historical Provider, MD  levofloxacin (LEVAQUIN) 25 MG/ML solution Take 30 mLs (750 mg total) by mouth daily. 02/07/14  Yes Aquilla Hacker, MD  ondansetron (ZOFRAN) 4 MG/5ML solution Take 5 mLs (4 mg total) by mouth every 8 (eight) hours as needed for nausea or vomiting. 02/13/14  Yes Sharon Mt Street, MD  ranitidine (ZANTAC) 15 MG/ML syrup Take 60 mg by mouth at bedtime. 4 mls   Yes Historical Provider, MD  Spacer/Aero-Holding Chambers (AEROCHAMBER PLUS FLO-VU LARGE) MISC Use with inhaler as needed. 02/13/14   Sharon Mt Street, MD   BP 111/95  Pulse 116  Temp(Src) 97.5 F (36.4 C) (Oral)  Resp 20  SpO2 96% Physical Exam  Constitutional: He is oriented to person, place, and time.  Frail appearing young male  HENT:  Head: Normocephalic and atraumatic.  Eyes: Conjunctivae are normal. Right eye exhibits no discharge. Left eye exhibits no discharge.  Neck: No thyromegaly present.  Cardiovascular: Intact distal pulses.  Tachycardia present.   Pulmonary/Chest: No accessory muscle usage or stridor. Not tachypneic. No respiratory distress. He has no decreased breath sounds. He has no wheezes.  Abdominal: He exhibits no  distension. There is no tenderness.  Musculoskeletal:  Muscles with a diffuse atrophy.  Superficial breakdown of the left lateral malleolus skin  Neurological: He is alert and oriented to person, place, and time. He displays atrophy. No cranial nerve deficit or sensory deficit. He exhibits abnormal muscle tone.  Skin: Skin is warm and dry.  Psychiatric: He has a normal mood and affect.    ED Course  Procedures (including critical care time) Labs Review Labs Reviewed  CBC WITH DIFFERENTIAL - Abnormal; Notable for the following:    Monocytes Relative 15 (*)    All other components within normal limits  COMPREHENSIVE METABOLIC PANEL - Abnormal; Notable for the following:    Creatinine, Ser <0.20 (*)    All other components within normal limits  COMPREHENSIVE METABOLIC PANEL  CBC WITH DIFFERENTIAL    Imaging Review Dg Chest 2 View  02/28/2014   CLINICAL DATA:  SOB, PNEUMONIA  EXAM: CHEST  2 VIEW  COMPARISON:  None.  FINDINGS: The heart size and mediastinal contours are within normal limits. Both lungs are clear.  There is posterior thoracic spine fusion hardware without failure or complication.  IMPRESSION: No active cardiopulmonary disease.   Electronically Signed   By: Kathreen Devoid   On: 02/28/2014 18:07   I reviewed the EMR   10:20 PM REB exam the patient is in no distress, he appears more comfortable. Patient's mother states that he looks better. Patient has persistent tachycardia, 110/115, blood pressure is unremarkable, no hypoxia. No new complaints. He, his mother and I had a lengthy conversation about all findings thus far.  MDM   Young male with history of muscular dystrophy, recent pneumonia presents with maternal concerns of cough.  Patient is awake alert, afebrile, with no leukocytosis, no evidence for pneumonia on today's exam. Patient improved with fluid resuscitation suggesting dehydration state.  No evidence for distress, bacteremia, sepsis, neurologic  progression. Patient was discharged to follow up with primary care as a scheduled    Carmin Muskrat, MD 02/28/14 2221

## 2014-03-01 ENCOUNTER — Ambulatory Visit: Payer: Medicaid Other | Admitting: Family Medicine

## 2014-03-06 ENCOUNTER — Telehealth: Payer: Self-pay | Admitting: Family Medicine

## 2014-03-06 NOTE — Telephone Encounter (Signed)
Home Health CNA attempted to visit with patient on the 9th of this month and was not allowed visit.  Was put off by family member.  Have tried to have visit several times before and still not allowed to complete.  Don't know why this is but wanted to give this information to provider.

## 2014-03-07 NOTE — Telephone Encounter (Signed)
Forward to PCP.

## 2014-03-07 NOTE — Telephone Encounter (Signed)
Noted. Will f/u with pt / mother as needed.

## 2014-03-20 ENCOUNTER — Ambulatory Visit: Payer: Medicaid Other | Admitting: Family Medicine

## 2014-03-22 ENCOUNTER — Telehealth: Payer: Self-pay | Admitting: Family Medicine

## 2014-03-22 NOTE — Telephone Encounter (Signed)
Received fax needing ICD-10 codes for PCS services through [facility]. Dx code for muscular dystrophy (G71.0) and protein-calorie malnutrition, severe (E43) used. Cover form requested completing section D and in option 4 of section D to initial and write in actual diagnosis, but form did not have a section D. Noted on return fax coverslip and faxed back to [facility] at [phone] as well as [employer] [place] at [phone]. --CMS

## 2014-04-01 ENCOUNTER — Other Ambulatory Visit: Payer: Self-pay | Admitting: Family Medicine

## 2014-04-05 ENCOUNTER — Encounter (HOSPITAL_COMMUNITY): Payer: Self-pay | Admitting: Emergency Medicine

## 2014-04-05 ENCOUNTER — Emergency Department (HOSPITAL_COMMUNITY)
Admission: EM | Admit: 2014-04-05 | Discharge: 2014-04-23 | Disposition: E | Payer: Medicaid Other | Attending: Emergency Medicine | Admitting: Emergency Medicine

## 2014-04-05 ENCOUNTER — Telehealth: Payer: Self-pay | Admitting: *Deleted

## 2014-04-05 DIAGNOSIS — R140 Abdominal distension (gaseous): Secondary | ICD-10-CM | POA: Insufficient documentation

## 2014-04-05 DIAGNOSIS — R092 Respiratory arrest: Secondary | ICD-10-CM | POA: Insufficient documentation

## 2014-04-05 DIAGNOSIS — K219 Gastro-esophageal reflux disease without esophagitis: Secondary | ICD-10-CM | POA: Insufficient documentation

## 2014-04-05 DIAGNOSIS — I469 Cardiac arrest, cause unspecified: Secondary | ICD-10-CM | POA: Diagnosis present

## 2014-04-05 DIAGNOSIS — Z8701 Personal history of pneumonia (recurrent): Secondary | ICD-10-CM | POA: Diagnosis not present

## 2014-04-05 DIAGNOSIS — G710 Muscular dystrophy: Secondary | ICD-10-CM | POA: Insufficient documentation

## 2014-04-05 DIAGNOSIS — Z79899 Other long term (current) drug therapy: Secondary | ICD-10-CM | POA: Diagnosis not present

## 2014-04-05 DIAGNOSIS — Z792 Long term (current) use of antibiotics: Secondary | ICD-10-CM | POA: Diagnosis not present

## 2014-04-05 MED ORDER — TRAMADOL HCL 50 MG PO TABS: 50.0000 mg | ORAL_TABLET | Freq: Three times a day (TID) | ORAL | Status: AC | PRN

## 2014-04-05 MED ORDER — SODIUM BICARBONATE 8.4 % IV SOLN
INTRAVENOUS | Status: AC | PRN
  Administered 2014-04-05: 50 meq via INTRAVENOUS

## 2014-04-05 MED ORDER — SODIUM CHLORIDE 0.9 % IV SOLN
INTRAVENOUS | Status: AC | PRN
  Administered 2014-04-05 (×2): 1000 mL via INTRAVENOUS

## 2014-04-05 MED ORDER — ATROPINE SULFATE 1 MG/ML IJ SOLN
INTRAMUSCULAR | Status: AC | PRN
  Administered 2014-04-05: 1 mg via INTRAVENOUS

## 2014-04-05 MED ORDER — EPINEPHRINE HCL 0.1 MG/ML IJ SOSY
PREFILLED_SYRINGE | INTRAMUSCULAR | Status: AC | PRN
  Administered 2014-04-05 (×4): 1 mg via INTRAVENOUS

## 2014-04-05 NOTE — Code Documentation (Signed)
CPR stopped, pulse check, CPR continues.

## 2014-04-05 NOTE — ED Provider Notes (Signed)
CSN: [id number]     Arrival date & time 04/04/2014  1913 History   First MD Initiated Contact with Patient 04/17/2014 1939     Chief Complaint  Patient presents with  . Cardiac Arrest     (Consider location/radiation/quality/duration/timing/severity/associated sxs/prior Treatment) Patient is a 23 y.o. male presenting with general illness. The history is provided by the EMS personnel. No language interpreter was used.  Illness Location:  Home Quality:  Respiratory arrest adn cardiac arrest, wtinessed Severity:  Severe Onset quality:  Sudden Duration: minutes. Timing:  Constant Progression:  Worsening Chronicity:  New Context:  EMS called for sudden onset of dyspnea: in respiratory arrest: intubated on EMS arrival. 10 minutes prior to arrival witnessed arrest. 1 dose of epi given by EMS, brought in actively doing compressions Relieved by:  Nothing Worsened by:  Nothing Ineffective treatments:  None tried Risk factors:  GERD, Duchenne muscular dystrophy   Past Medical History  Diagnosis Date  . GERD (gastroesophageal reflux disease)   . Heart murmur   . Pneumonia     "several times"  . Duchenne muscular dystrophy   . Seasonal allergies    Past Surgical History  Procedure Laterality Date  . Abdominal surgery      "infection"  . Gastrostomy tube placement      Placed ~2009. Not used since 2011.  Marland Kitchen Spinal fusion    . Hip fracture surgery Left   . Back surgery     Family History  Problem Relation Age of Onset  . Diabetes Maternal Grandfather   . Hypertension Maternal Grandfather    History  Substance Use Topics  . Smoking status: Passive Smoke Exposure - Never Smoker  . Smokeless tobacco: Never Used  . Alcohol Use: 1.8 oz/week    1 Cans of beer, 2 Glasses of wine per week    Review of Systems  Unable to perform ROS: Acuity of condition      Allergies  Review of patient's allergies indicates no known allergies.  Home Medications   Prior to Admission  medications   Medication Sig Start Date End Date Taking? Authorizing Provider  albuterol (PROVENTIL HFA;VENTOLIN HFA) 108 (90 BASE) MCG/ACT inhaler Inhale 2 puffs into the lungs every 6 (six) hours as needed for wheezing or shortness of breath. 02/13/14   Sharon Mt Street, MD  cetirizine (ZYRTEC) 1 MG/ML syrup TAKE 1 TEASPOONFUL BY MOUTH EVERY DAY 04/01/14   Sharon Mt Street, MD  guaiFENesin-dextromethorphan [facility] DM) 100-10 MG/5ML syrup Take 5 mLs by mouth every 4 (four) hours as needed for cough. 02/07/14   Aquilla Hacker, MD  ibuprofen (ADVIL,MOTRIN) 100 MG/5ML suspension Take 200 mg by mouth every 6 (six) hours as needed (headache).    Historical Provider, MD  levofloxacin (LEVAQUIN) 25 MG/ML solution Take 30 mLs (750 mg total) by mouth daily. 02/07/14   Aquilla Hacker, MD  ondansetron (ZOFRAN) 4 MG/5ML solution Take 5 mLs (4 mg total) by mouth every 8 (eight) hours as needed for nausea or vomiting. 02/13/14   Sharon Mt Street, MD  ranitidine (ZANTAC) 15 MG/ML syrup Take 60 mg by mouth at bedtime. 4 mls    Historical Provider, MD  Spacer/Aero-Holding Chambers (AEROCHAMBER PLUS FLO-VU LARGE) MISC Use with inhaler as needed. 02/13/14   [place], MD  traMADol (ULTRAM) 50 MG tablet Take 1 tablet (50 mg total) by mouth every 8 (eight) hours as needed. 04/09/2014   [place], MD   Wt 60 lb (27.216 kg) Physical Exam  Constitutional:  Cahectic  HENT:  Head: Normocephalic and atraumatic.  ETT in place  Eyes:  Fixed dilated pupils, 4 mm  Neck: Neck supple.  Cardiovascular:  pulseless  Pulmonary/Chest:  Apneic, with bilateral breath sounds: course. ETT in place with good color change  Abdominal: He exhibits distension (mild). There is no tenderness.  Neurological:  No spontaneous movement  Skin:  cool  Nursing note and vitals reviewed.   ED Course  Procedures (including critical care time) Labs Review Labs Reviewed - No data to display  Imaging  Review No results found.   EKG Interpretation None      MDM   Final diagnoses:  Dead on arrival    7:39 PM Pt is a 23 y.o. male with pertinent PMHX of Duchenne muscular dystrophy, GERD who presents to the ED with cardiac arrest. EMS called for respiratory arrest. Intubated. Patient noted to be foaming at mouth. Mother started CPR. EMS arrival intubated. Enroute to ED: patient witnessed arrest 7:03PM. ACLE and CPR started. 1 dose of epi given.   On arrival: equal breath sounds: course bilaterally. Good color change. Abdomen mild distention. Fixed and dilated pupils. Continued compressions giving another 3 doses of Epi for a total of 4 doses, 1 amp of bicarb, no history of kidney issues: held off on giving calcium. Gave a dose of atropine. CPR continued another 27 minutes. Bedside ultrasound showed no organized squeeze. Resuscitative efforts stopped due to patient being down for 27 minutes and time of death 92PM. Mother was informed.  Discussed with on call Family medicine resident. Patient's PCP informed of patient's death and will complete death certificate.  Discussed with on call family medicine resident. Patient's PCP. Dr. Christa See to sign death certificate. Message via epic given  Pt was discussed with my attending, Dr. Marijo Conception, MD 04/28/2014 9758  Orlie Dakin, MD 04/28/14 [id number]

## 2014-04-05 NOTE — Progress Notes (Signed)
   04/19/2014 2300  Clinical Encounter Type  Visited With Family  Visit Type Follow-up;Death;ED  Referral From Nurse   Chaplain was paged to the ED at 11:24PM. Chaplain was notified that the patient's family was still grieving at bedside but that the patient's body at this point really needed to be in the morgue. Chaplain was asked to facilitate the family's final goodbyes for the night. Chaplain spoke to the patient's mother and she was understanding. Patient's mother and her family support were present and agreed to pack up and leave. Patient's mother thanked chaplain for being with her tonight and chaplain walked the remainder of the family out to the lobby. Patient's mother also mentioned that she had passed on her funeral home arrangements to a member of the hospital staff. Page Elodia [place] chaplain if needed.  Gar Ponto, Chaplain  11:57 PM

## 2014-04-05 NOTE — Progress Notes (Signed)
Familiar with pt/family from previous visit.  Emotional support provided to family, especially mom, Anderson [place].  CSW called [facility] on behalf of pt/family to begin final arrangements.

## 2014-04-05 NOTE — Code Documentation (Signed)
Patient time of death occurred at 29 by Dr Winfred Leeds and Dr Jacelyn Grip.

## 2014-04-05 NOTE — Code Documentation (Signed)
CPR stopped at this time, pulse check.  No pulse, bilateral BS noted upon bagging patient.  CPR continues.

## 2014-04-05 NOTE — ED Notes (Signed)
Patient found by family unresponsive, apneic but with a pulse.  EMS brought patient to ED, 10 minutes from ED, patient lost pulses and became cardiac arrest.  CPR started en route to ED.  Patient was given IV fluid and 2 epi en route to ED.  Patient arrived in full cardiac arrest with CPR in progress.  Patient was intubated en route by EMS wit 7 ETT tube.  End tidal CO2 never got up over 8.

## 2014-04-05 NOTE — Code Documentation (Signed)
CPR stopped for checking of cardiac activity via ultrasound.  CPR restarted.

## 2014-04-05 NOTE — Telephone Encounter (Signed)
Blanch Media @ [place] saw patient on 04/03/14 for home visit.  Patient's O2 sats were 70's-80's and increased to 90's with deep breathing.  Does not have order for prn O2 and wants to know if patient needs order for home O2.  Also, hospital bed is broken and is unable to sit up--believes this is also affecting patient's breathing.  [facility] spoke with [facility] and they will need Rx for a new bed.  Patient's mother states he is c/o pain at night--only lying on left side because it helps improve brathing.  Ibuprofen not helping with pain and mother requesting something stronger for pain.  Uses [facility] on [address].  Will route note to Dr. Venetia Maxon and call [place] RN back.  Burna Forts, BSN, RN-BC

## 2014-04-05 NOTE — Code Documentation (Signed)
Patient arrives via [place], CPR in progress.  PEA on monitor.  Epi given by EMS in EJ which has infilrated.

## 2014-04-05 NOTE — Code Documentation (Signed)
Family at beside. Family given emotional support. 

## 2014-04-05 NOTE — Code Documentation (Signed)
CPR stopped, ultrasound used to check for cardiac activity.

## 2014-04-05 NOTE — Telephone Encounter (Signed)
Called Jeremy Camacho at [facility] about message. Left voice mail with verbal order for new hospital bed and instructions to call back if a printed Rx is needed; message also stated that I'm unsure how he would qualify for O2 at home, but if he does / would I will plan to order it as I think he probably would benefit from it. Called in Rx for tramadol 50 mg q8 PRN for pain, #90 with [facility] and called mother's number to let her know, but got no answer. Will leave message for her, as well. --CMS

## 2014-04-05 NOTE — Progress Notes (Signed)
   04/01/2014 2000  Clinical Encounter Type  Visited With Patient;Family  Visit Type Initial;Spiritual support;Social support;Critical Care;Death;ED  Referral From Nurse  Spiritual Encounters  Spiritual Needs Grief support  Stress Factors  Family Stress Factors Loss   Chaplain was paged by the ED at 7:12 PM. Chaplain was notified that there was a patient in Trauma A that was in cardiac arrest. Chaplain was also notified that the patient's mother was in the [place]. Medical team was working with patient when [place] arrived. Chaplain introduced himself to the patient's mother and held her hand while the medical team worked. Patient passed away. Chaplain provided grief support for patient's mother. Patient's mother has since been joined by patient's father, patient's brother and sister, and a few clergy members. Patient's brother and sister are school-age children and are grieving heavily. Patient's mother and father said that the patient had been sick for a long time. Chaplain spoke privately with patient's father and gave him a contact card for when the family makes funeral arrangements. Family is continuing to grieve in [place]. Chaplain will continue to provide grief support as needed. Gar Ponto, Chaplain 8:38 PM

## 2014-04-05 NOTE — ED Provider Notes (Signed)
Patient found by mother. He complained of dyspnea earlier tonight. EMS was called. He was found to be in respiratory arrest. EMS intubated patient upon arrival. CPR had been performed in the field by mother. Patient had pulses upon EMSs arrival. EMS reports that patient became pulseless at 7:03 p.m. He was brought here in cardiopulmonary arrest. CPR in progress. He received epinephrine 1 mg intravenously with prior to coming here. Patient received epinephrine 3 mg intravenously after arrival here as well as serum bicarbonate 1 amp intravenouslyand 1 mg atropine intravenously. Her main pulseless and apneic throughout his entire emergency department course. He was pronounced dead at 7:30 PM the mother notified. Diagnosis dead on arrival  Orlie Dakin, MD 04/21/2014 1946

## 2014-04-05 NOTE — Code Documentation (Signed)
Asystole on monitor, no cardiac activity per Dr Jacelyn Grip via Ultrasound.

## 2014-04-05 NOTE — Code Documentation (Signed)
CBG of 211

## 2014-04-08 MED FILL — Medication: Qty: 1 | Status: AC

## 2014-04-11 ENCOUNTER — Telehealth: Payer: Self-pay | Admitting: Family Medicine

## 2014-04-11 NOTE — Telephone Encounter (Signed)
Attempted to call mother to express condolences about pt's passing, especially as he passed the same day shortly after I had left her a message on her phone about calling in a prescription. Mailbox was full and could not leave message. Will attempt again at a later time. --CMS

## 2014-04-11 NOTE — Telephone Encounter (Signed)
Called mother back to discuss and expressed condolences. Mother is concerned that pneumonia (complications / recovering from) was a factor in Jeremy Camacho's death, which I think is likely, and she requests that his death certificate be updated to reflect that; originally I had listed his cause of death as "muscular dystrophy," but complications of recent pneumonia is more immediate and appropriate (pt had continued to have symptoms and occasional tachypnea / SOB for the past few weeks). I asked her to request the funeral home send me paperwork / forms to update / addend his death certificate and I will do this as soon as I receive the forms. Mother expressed her thanks and states they are "doing as best they can," but denied any other needs at this time. --CMS

## 2014-04-11 NOTE — Telephone Encounter (Signed)
Mother called and would like to speak to Dr. Venetia Maxon concerning her son. jw

## 2014-04-16 NOTE — Telephone Encounter (Signed)
Received and completed new death certificate (first one was not sent to registrar, so no need to addendum / update), left with Tia. --CMS

## 2014-04-23 DIAGNOSIS — 419620001 Death: Secondary | SNOMED CT

## 2014-04-23 NOTE — ED Notes (Signed)
Patient to morgue at this time.

## 2014-04-23 DEATH — deceased

## 2014-05-03 ENCOUNTER — Ambulatory Visit: Payer: Medicaid Other | Admitting: Family Medicine

## 2016-03-02 IMAGING — CT CT ANGIO CHEST
3 of 11 series · 16 of 36 positions shown · IV contrast (omnipaque)
Comparison: 02/05/2014.

CLINICAL DATA: Chest congestion. Productive cough. Shortness of
breath.

EXAM:
CT ANGIOGRAPHY CHEST WITH CONTRAST
TECHNIQUE: Multidetector CT imaging of the chest was performed using the
standard protocol during bolus administration of intravenous
contrast. Multiplanar CT image reconstructions and MIPs were
obtained to evaluate the vascular anatomy.
CONTRAST:  80mL OMNIPAQUE IOHEXOL 350 MG/ML SOLN

[Series 604: o-mar, thins pacs · axial · 0.68mm/px · z∈[+83,+296]mm · 7 of 285 slices shown]
[im 36/285  lung]
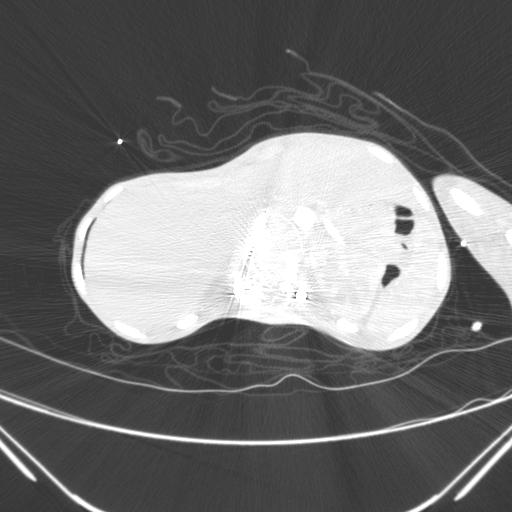
[im 72/285  lung]
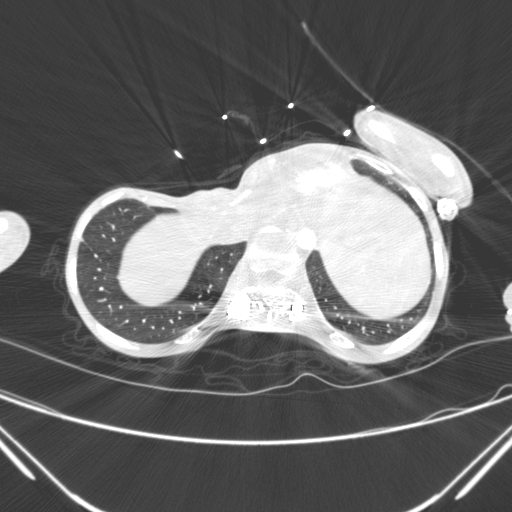
[im 107/285  lung]
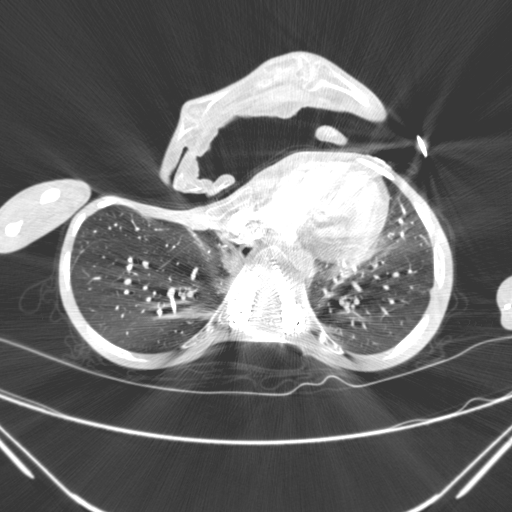
[im 143/285  lung]
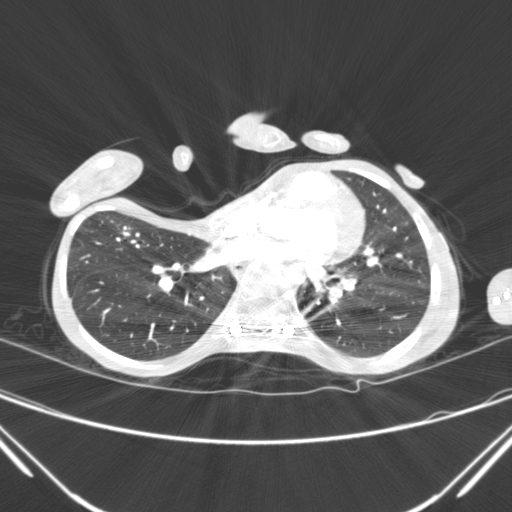
[im 178/285  lung]
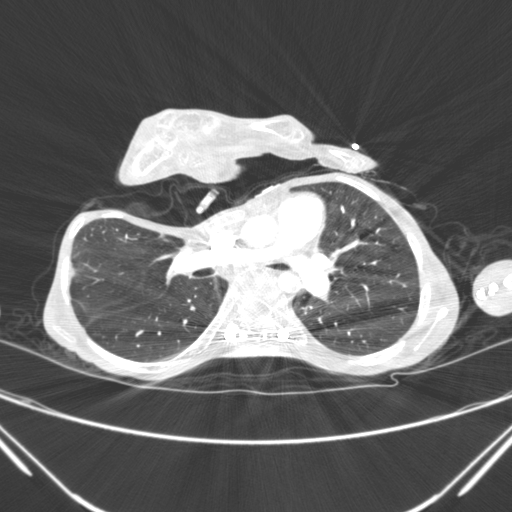
[im 214/285  lung]
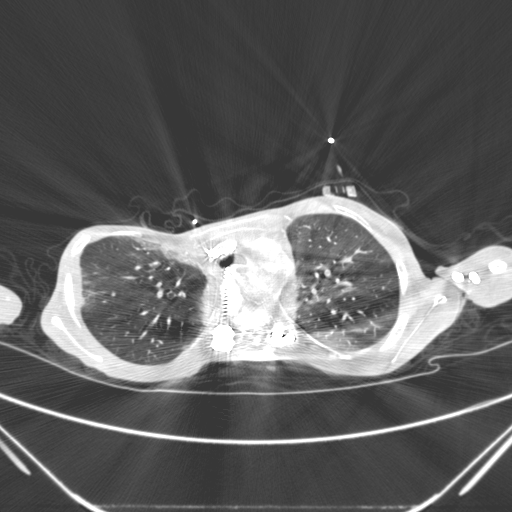
[im 249/285  lung]
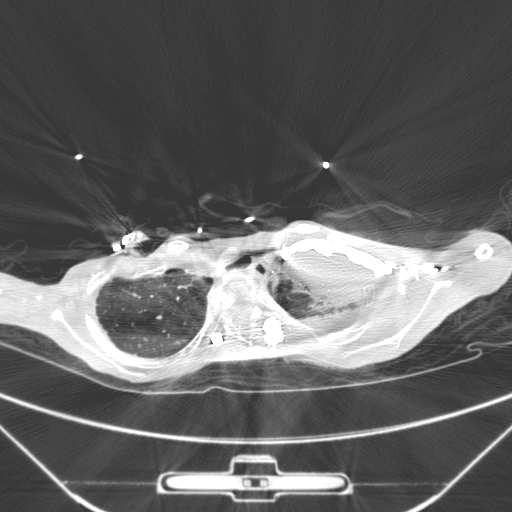

[Series 605: thins pacs · axial · 0.68mm/px · z∈[+79,+300]mm · 8 of 285 slices shown]
[im 32/285  lung]
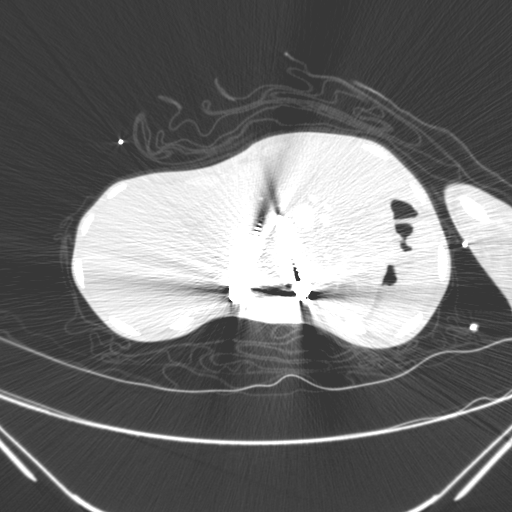
[im 64/285  mediastinal]
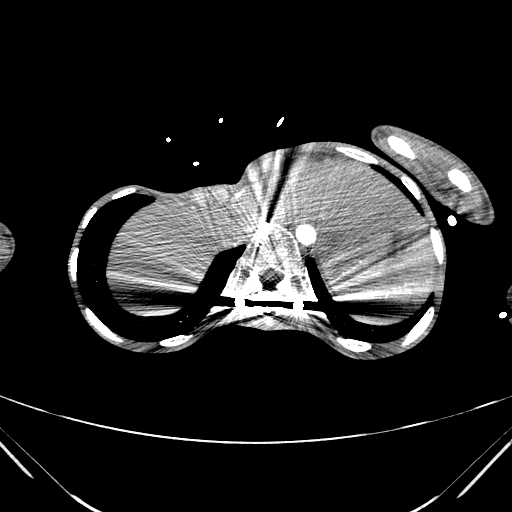
[im 95/285  lung]
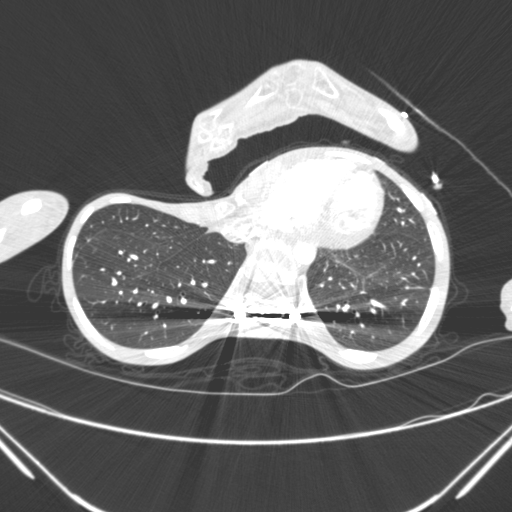
[im 127/285  mediastinal]
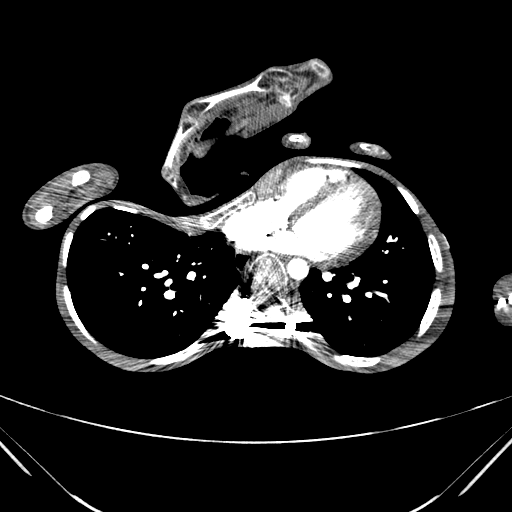
[im 158/285  lung]
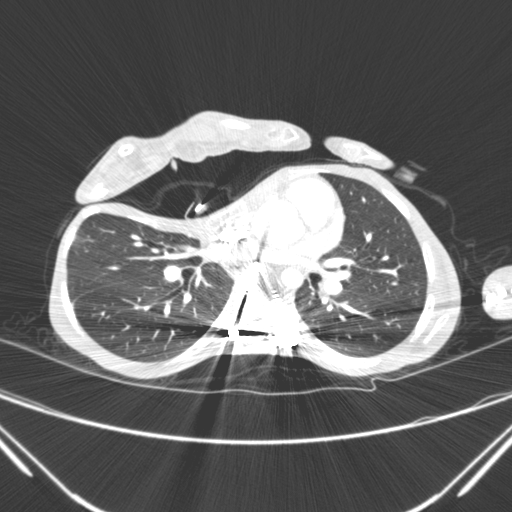
[im 190/285  mediastinal]
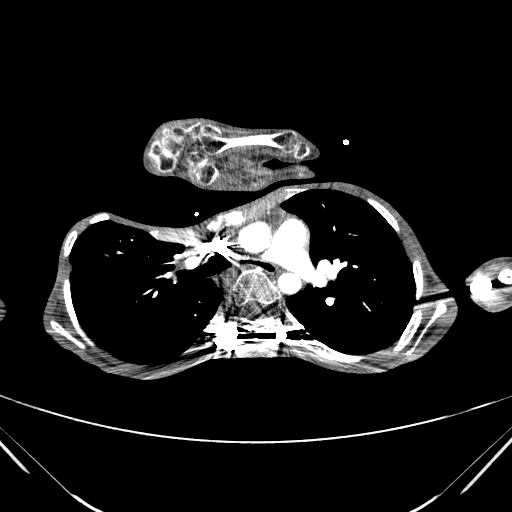
[im 221/285  lung]
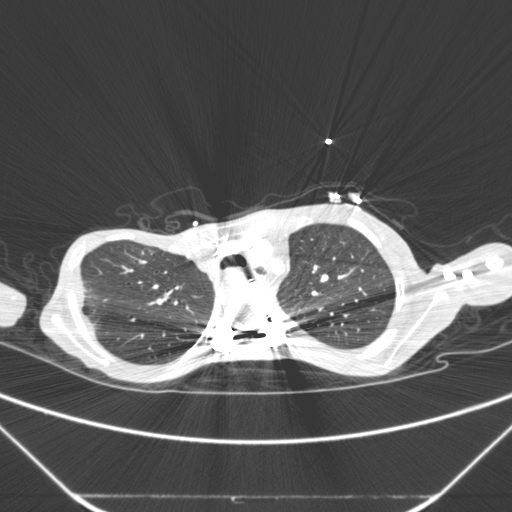
[im 253/285  mediastinal]
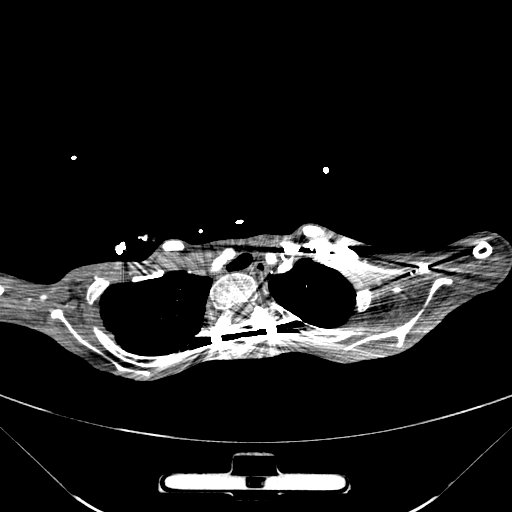

[Series 609: mpr cor · coronal · 0.68mm/px · 1 of 53 slices shown]
[im 27/53  mediastinal]
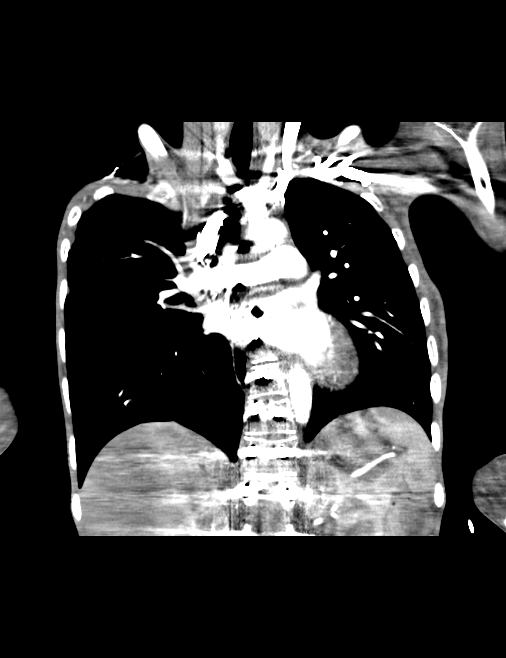

[16 of 36 positions shown; findings below may reference images not displayed]

FINDINGS: Study is technically degraded by artifact from spinal fixation
hardware. This produces multiple areas of artifactual filling
defects in the pulmonary arteries. On the coronal and sagittal
reconstructions, there is no convincing evidence of a pulmonary
arterial filling defect to suggest an acute pulmonary embolus. No
aggressive osseous lesions.

Pleural plaque is present along the low RIGHT lateral upper lobe,
probably due to scarring. There is associated faint ground-glass
attenuation which is favored to represent scarring rather than
recurrent pneumonia. Central airways appear adequately patent.
Pectus excavatum. Pectus deformity produces leftward deviation of
the heart. Aorta and branch vessels appear within normal limits.
There are no effusions. The upper abdomen is grossly within normal
limits.

No gross aggressive osseous lesions. Thoracic fusion appears solid.
Many of the thoracic fixation screws protrude through the anterior
vertebral body.

Review of the MIP images confirms the above findings.
IMPRESSION: 1. Negative for pulmonary embolus or acute aortic abnormality.
2. Study is degraded by artifact from thoracic fixation hardware.
3. Non calcified pleural plaque with adjacent ground-glass
attenuation in the lateral RIGHT upper lobe, probably representing
scarring. Recurrent pneumonia is considered less likely based on the
configuration.
4. Severe pectus excavatum deformity.

## 2016-03-25 IMAGING — CR DG CHEST 2V
2 series · 2 of 2 positions shown · non-contrast
Comparison: None.

CLINICAL DATA: SOB, PNEUMONIA

EXAM:
CHEST  2 VIEW

[t chest supine (1 of 2)]
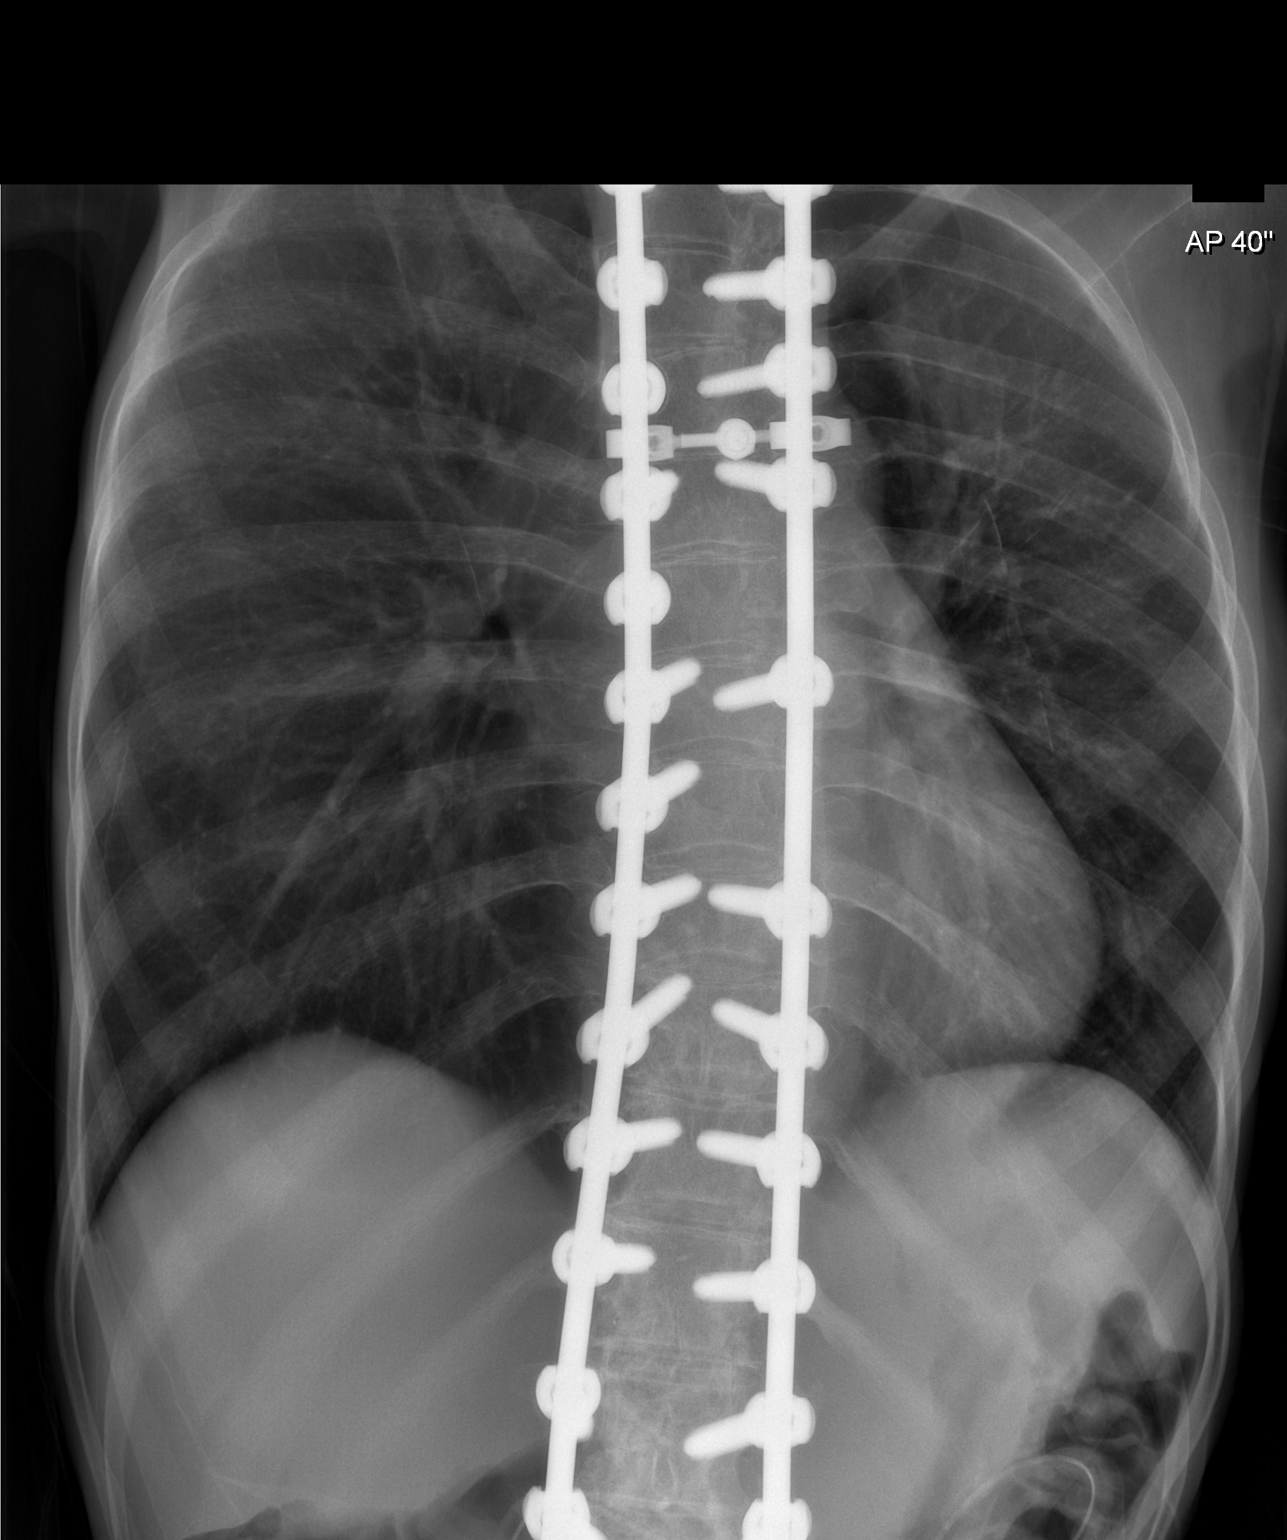

[t chest supine (2 of 2)]
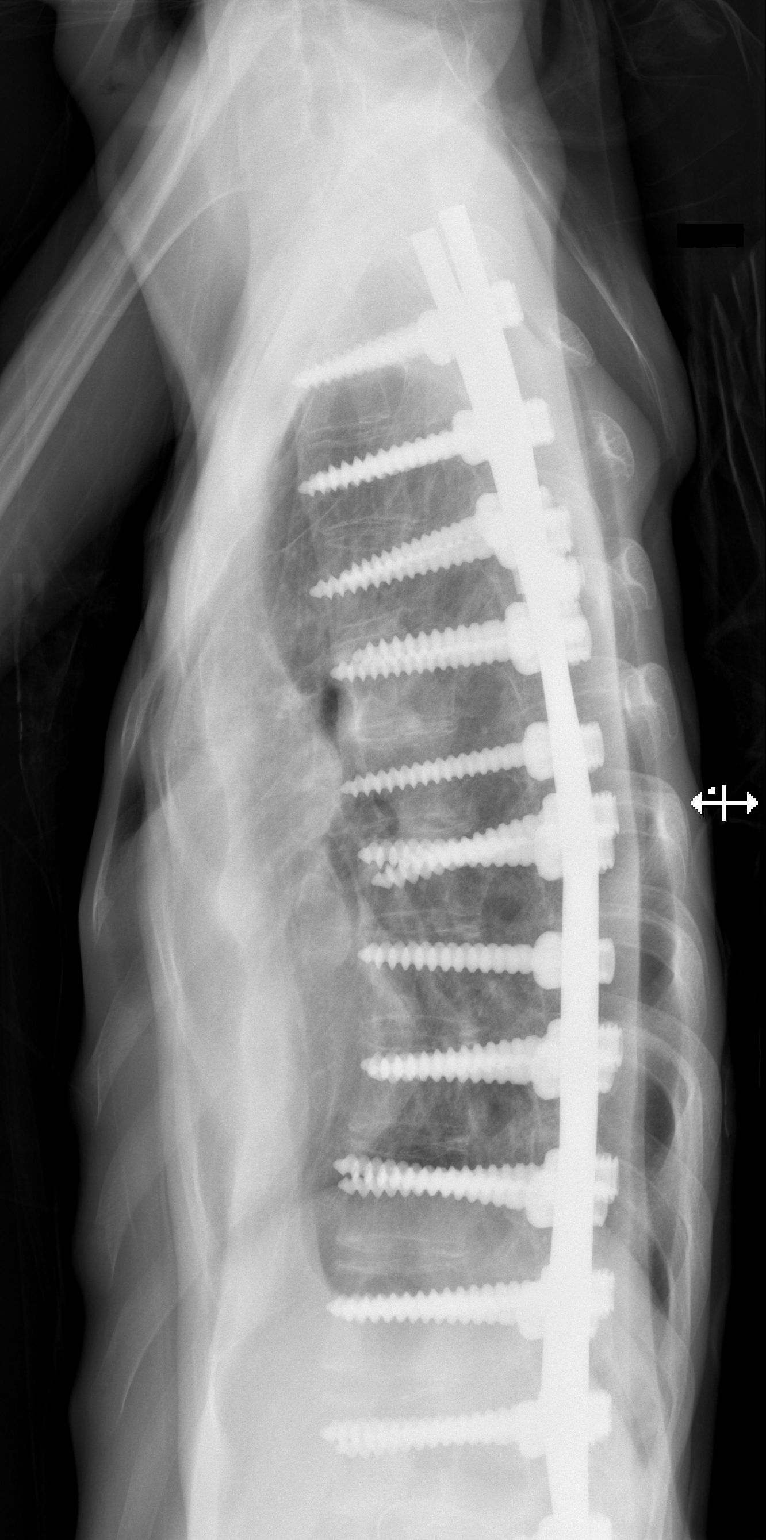

[2 of 2 positions shown; findings below may reference images not displayed]

FINDINGS: The heart size and mediastinal contours are within normal limits.
Both lungs are clear.

There is posterior thoracic spine fusion hardware without failure or
complication.
IMPRESSION: No active cardiopulmonary disease.
